# Patient Record
Sex: Female | Born: 1970 | Race: Black or African American | Hispanic: No | Marital: Single | Smoking: Never smoker
Health system: Southern US, Community
[De-identification: ages and names within clinical notes are randomized; demographics above are authoritative.]

## PROBLEM LIST (undated history)

## (undated) DIAGNOSIS — D649 Anemia, unspecified: Secondary | ICD-10-CM

## (undated) DIAGNOSIS — J45909 Unspecified asthma, uncomplicated: Secondary | ICD-10-CM

## (undated) DIAGNOSIS — E669 Obesity, unspecified: Secondary | ICD-10-CM

## (undated) HISTORY — DX: Unspecified asthma, uncomplicated: J45.909

## (undated) HISTORY — DX: Anemia, unspecified: D64.9

## (undated) HISTORY — DX: Obesity, unspecified: E66.9

## (undated) HISTORY — PX: OTHER SURGICAL HISTORY: SHX169

## (undated) HISTORY — PX: CHOLECYSTECTOMY: SHX55

---

## 2004-09-18 ENCOUNTER — Ambulatory Visit: Payer: Self-pay | Admitting: Family Medicine

## 2004-11-19 ENCOUNTER — Ambulatory Visit: Payer: Self-pay | Admitting: Family Medicine

## 2005-04-29 ENCOUNTER — Ambulatory Visit: Payer: Self-pay | Admitting: Family Medicine

## 2005-07-07 ENCOUNTER — Ambulatory Visit: Payer: Self-pay | Admitting: Family Medicine

## 2005-10-02 ENCOUNTER — Ambulatory Visit: Payer: Self-pay | Admitting: Family Medicine

## 2005-10-09 ENCOUNTER — Encounter (HOSPITAL_COMMUNITY): Admission: RE | Admit: 2005-10-09 | Discharge: 2005-11-08 | Payer: Self-pay | Admitting: Family Medicine

## 2005-12-10 ENCOUNTER — Ambulatory Visit: Payer: Self-pay | Admitting: Family Medicine

## 2005-12-11 ENCOUNTER — Ambulatory Visit (HOSPITAL_COMMUNITY): Admission: RE | Admit: 2005-12-11 | Discharge: 2005-12-11 | Payer: Self-pay | Admitting: Family Medicine

## 2006-01-22 ENCOUNTER — Other Ambulatory Visit: Admission: RE | Admit: 2006-01-22 | Discharge: 2006-01-22 | Payer: Self-pay | Admitting: Family Medicine

## 2006-01-22 ENCOUNTER — Encounter: Payer: Self-pay | Admitting: Family Medicine

## 2006-01-22 ENCOUNTER — Ambulatory Visit: Payer: Self-pay | Admitting: Family Medicine

## 2006-07-09 ENCOUNTER — Ambulatory Visit: Payer: Self-pay | Admitting: Family Medicine

## 2006-07-24 ENCOUNTER — Encounter (HOSPITAL_COMMUNITY): Admission: RE | Admit: 2006-07-24 | Discharge: 2006-08-23 | Payer: Self-pay | Admitting: Family Medicine

## 2006-08-06 ENCOUNTER — Ambulatory Visit: Payer: Self-pay | Admitting: Family Medicine

## 2006-09-02 ENCOUNTER — Ambulatory Visit (HOSPITAL_COMMUNITY): Admission: RE | Admit: 2006-09-02 | Discharge: 2006-09-02 | Payer: Self-pay | Admitting: General Surgery

## 2006-09-02 ENCOUNTER — Encounter (INDEPENDENT_AMBULATORY_CARE_PROVIDER_SITE_OTHER): Payer: Self-pay | Admitting: *Deleted

## 2007-06-24 ENCOUNTER — Ambulatory Visit: Payer: Self-pay | Admitting: Family Medicine

## 2007-06-24 ENCOUNTER — Encounter: Payer: Self-pay | Admitting: Family Medicine

## 2007-06-24 ENCOUNTER — Other Ambulatory Visit: Admission: RE | Admit: 2007-06-24 | Discharge: 2007-06-24 | Payer: Self-pay | Admitting: Family Medicine

## 2007-06-26 ENCOUNTER — Encounter: Payer: Self-pay | Admitting: Family Medicine

## 2007-06-26 LAB — CONVERTED CEMR LAB
Candida species: NEGATIVE
Chlamydia, DNA Probe: NEGATIVE
GC Probe Amp, Genital: NEGATIVE
Gardnerella vaginalis: POSITIVE — AB
Trichomonal Vaginitis: NEGATIVE

## 2007-08-18 ENCOUNTER — Ambulatory Visit: Payer: Self-pay | Admitting: Family Medicine

## 2007-10-21 ENCOUNTER — Encounter: Payer: Self-pay | Admitting: Family Medicine

## 2008-05-31 ENCOUNTER — Encounter: Payer: Self-pay | Admitting: Family Medicine

## 2008-05-31 DIAGNOSIS — E669 Obesity, unspecified: Secondary | ICD-10-CM

## 2008-06-13 ENCOUNTER — Telehealth: Payer: Self-pay | Admitting: Family Medicine

## 2008-06-23 ENCOUNTER — Encounter: Payer: Self-pay | Admitting: Family Medicine

## 2008-10-18 ENCOUNTER — Telehealth: Payer: Self-pay | Admitting: Family Medicine

## 2008-10-30 ENCOUNTER — Ambulatory Visit: Payer: Self-pay | Admitting: Family Medicine

## 2009-03-12 ENCOUNTER — Ambulatory Visit: Payer: Self-pay | Admitting: Family Medicine

## 2009-03-12 DIAGNOSIS — L732 Hidradenitis suppurativa: Secondary | ICD-10-CM | POA: Insufficient documentation

## 2009-03-12 DIAGNOSIS — N946 Dysmenorrhea, unspecified: Secondary | ICD-10-CM

## 2009-03-12 DIAGNOSIS — N920 Excessive and frequent menstruation with regular cycle: Secondary | ICD-10-CM

## 2009-03-14 ENCOUNTER — Telehealth: Payer: Self-pay | Admitting: Family Medicine

## 2009-06-11 ENCOUNTER — Telehealth: Payer: Self-pay | Admitting: Family Medicine

## 2009-12-25 ENCOUNTER — Encounter: Payer: Self-pay | Admitting: Physician Assistant

## 2009-12-28 ENCOUNTER — Ambulatory Visit: Payer: Self-pay | Admitting: Family Medicine

## 2009-12-28 DIAGNOSIS — L0390 Cellulitis, unspecified: Secondary | ICD-10-CM

## 2009-12-28 DIAGNOSIS — L0291 Cutaneous abscess, unspecified: Secondary | ICD-10-CM | POA: Insufficient documentation

## 2009-12-31 ENCOUNTER — Telehealth: Payer: Self-pay | Admitting: Physician Assistant

## 2010-05-27 ENCOUNTER — Ambulatory Visit: Payer: Self-pay | Admitting: Family Medicine

## 2010-05-27 ENCOUNTER — Other Ambulatory Visit: Admission: RE | Admit: 2010-05-27 | Discharge: 2010-05-27 | Payer: Self-pay | Admitting: Family Medicine

## 2010-05-27 DIAGNOSIS — N912 Amenorrhea, unspecified: Secondary | ICD-10-CM

## 2010-05-27 DIAGNOSIS — R5381 Other malaise: Secondary | ICD-10-CM

## 2010-05-27 DIAGNOSIS — R5383 Other fatigue: Secondary | ICD-10-CM

## 2010-05-29 ENCOUNTER — Encounter: Payer: Self-pay | Admitting: Family Medicine

## 2010-11-10 ENCOUNTER — Encounter: Payer: Self-pay | Admitting: Family Medicine

## 2010-11-19 NOTE — Letter (Signed)
Summary: history and physical  history and physical   Imported By: Curtis Sites 03/06/2010 14:16:34  _____________________________________________________________________  External Attachment:    Type:   Image     Comment:   External Document

## 2010-11-19 NOTE — Letter (Signed)
Summary: labs  labs   Imported By: Curtis Sites 03/06/2010 14:16:51  _____________________________________________________________________  External Attachment:    Type:   Image     Comment:   External Document

## 2010-11-19 NOTE — Letter (Signed)
Summary: misc.  misc.   Imported By: Curtis Sites 03/06/2010 14:17:13  _____________________________________________________________________  External Attachment:    Type:   Image     Comment:   External Document

## 2010-11-19 NOTE — Assessment & Plan Note (Signed)
Summary: BOIL BUSTED   Vital Signs:  Patient profile:   40 year old female Menstrual status:  regular Height:      64 inches Weight:      185.50 pounds BMI:     31.96 O2 Sat:      98 % on Room air Pulse rate:   106 / minute Pulse rhythm:   regular Resp:     16 per minute BP sitting:   126 / 70  (right arm)  Vitals Entered By: Adella Hare LPN (December 28, 2009 11:14 AM)  Nutrition Counseling: Patient's BMI is greater than 25 and therefore counseled on weight management options.  O2 Flow:  Room air                   CC: boil bursted Is Patient Diabetic? No Pain Assessment Patient in pain? no        CC:  boil bursted.  History of Present Illness: boil on left armpit, first came up aboput 1 wek ago, got larger, was seen in urgent care 2 days ago when she developed chills, and was started on doxycycline. The abcess burst today, she has alot of pain, she does have an appt with a surgeon in 3 days, second episode,had one 6 months ago in the same area no h/o dm  Current Medications (verified): 1)  Ibu 800 Mg Tabs (Ibuprofen) .... Take 1 Tablet By Mouth Three Times A Day For  2 Days Before Onset of Next Menses and For The First 3 Days of The Menses 2)  Ortho-Cyclen (28) 0.25-35 Mg-Mcg Tabs (Norgestimate-Eth Estradiol) .... Use As Directed 3)  Doxycycline Hyclate 100 Mg Caps (Doxycycline Hyclate) .... Take 1 Tablet By Mouth Two Times A Day 4)  Fluconazole 150 Mg Tabs (Fluconazole) .... Take 1 Tablet By Mouth Once A Day  Allergies (verified): No Known Drug Allergies  Review of Systems      See HPI General:  Complains of chills and fever. ENT:  Denies hoarseness and nasal congestion. CV:  Denies chest pain or discomfort and palpitations. Resp:  Denies cough and sputum productive. GI:  Denies abdominal pain, constipation, and diarrhea. GU:  Denies dysuria and urinary frequency. Derm:  See HPI. Endo:  Denies cold intolerance, excessive hunger, excessive thirst,  excessive urination, heat intolerance, polyuria, and weight change.  Physical Exam  General:  Well-developed,obese,in no acute distress; alert,appropriate and cooperative throughout examination. pt in pain. HEENT: No facial asymmetry,  EOMI, No sinus tenderness, TM's Clear, oropharynx  pink and moist.   Chest: Clear to auscultation bilaterally.  CVS: S1, S2, No murmurs, No S3.   Abd: Soft, Nontender.  MS: Adequate ROM spine, hips, shoulders and knees.  Ext: No edema.   CNS: CN 2-12 intact, power tone and sensation normal throughout.   Skin: cellulitis, left anterior chest in axillary area, and draining abcess  Psych: Good eye contact, normal affect.  Memory intact, not anxious or depressed appearing.    Impression & Recommendations:  Problem # 1:  CELLULITIS (ICD-682.9) Assessment Comment Only  The following medications were removed from the medication list:    Doxycycline Hyclate 100 Mg Caps (Doxycycline hyclate) .Marland Kitchen... Take 1 tablet by mouth two times a day  Orders: Rocephin  250mg  (V7846) Admin of Therapeutic Inj  intramuscular or subcutaneous (96295), pt to continue antibiotics prscribed at urgent cARE, , THE AREA WAS CLEANSED WITH n/s AND DRY GAUZE DRESSING APPLIED.She has appt with surgeon in 3 days and is advised that should  her symptoms worsen , she should go to the Ed  Problem # 2:  OBESITY (ICD-278.00) Assessment: Unchanged  Ht: 64 (12/28/2009)   Wt: 185.50 (12/28/2009)   BMI: 31.96 (12/28/2009)  Complete Medication List: 1)  Ibu 800 Mg Tabs (Ibuprofen) .... Take 1 tablet by mouth three times a day for  2 days before onset of next menses and for the first 3 days of the menses 2)  Ortho-cyclen (28) 0.25-35 Mg-mcg Tabs (Norgestimate-eth estradiol) .... Use as directed 3)  Fluconazole 150 Mg Tabs (Fluconazole) .... Take 1 tablet by mouth once a day as needed 4)  Vicodin 5-500 Mg Tabs (Hydrocodone-acetaminophen) .... Take 1 tablet by mouth two times a day as  needed  Patient Instructions: 1)  You will have the abcess cleaned and a dry gauze applied, pls do at home also, usig normal saline. 2)  You will also get pain meds, and med for vag yeast infection if tis develops. 3)  PLS go to the Ed if the redness in the area spreads or you get fever and chills, since the infection is in n area where it can spread rapidly. 4)  You will get Rocephin today in the office  Prescriptions: VICODIN 5-500 MG TABS (HYDROCODONE-ACETAMINOPHEN) Take 1 tablet by mouth two times a day as needed  #30 x 0   Entered by:   Adella Hare LPN   Authorized by:   Syliva Overman MD   Signed by:   Adella Hare LPN on 86/57/8469   Method used:   Printed then faxed to ...       [facility] [address] (retail)       [address]       [place], [place]  [address]       Ph: [phone] or [phone]       Fax: [phone]   RxID:   [id number] FLUCONAZOLE 150 MG TABS (FLUCONAZOLE) Take 1 tablet by mouth once a day as needed  #3 x 0   Entered by:   Adella Hare LPN   Authorized by:   Syliva Overman MD   Signed by:   Adella Hare LPN on 60/63/0160   Method used:   Printed then faxed to ...       [facility] [address] (retail)       [address]       [place], [place]  [address]       Ph: [phone] or [phone]       Fax: [phone]   RxID:   [id number] VICODIN 5-500 MG TABS (HYDROCODONE-ACETAMINOPHEN) Take 1 tablet by mouth two times a day as needed  #30 x 0   Entered and Authorized by:   Syliva Overman MD   Signed by:   Syliva Overman MD on 12/28/2009   Method used:   Printed then faxed to ...       [facility] [address] (retail)       [address]       [place], [place]  [address]       Ph: [phone] or [phone]       Fax: [phone]   RxID:   [id number] FLUCONAZOLE 150 MG TABS (FLUCONAZOLE) Take 1 tablet by mouth once a day as  needed  #3 x 0   Entered and Authorized by:  Syliva Overman MD   Signed by:   Syliva Overman MD on 12/28/2009   Method used:   Electronically to        [facility] [address] (retail)       [address]       [place], [place]  [address]       Ph: [phone] or [phone]       Fax: [phone]   RxID:   [id number]    Medication Administration  Injection # 1:    Medication: Rocephin  250mg     Diagnosis: CELLULITIS (ICD-682.9)    Route: IM    Site: LUOQ gluteus    Exp Date: 4/13    Lot #: [id number]    Mfr: novaplus    Comments: rocephin 500mg  given    Patient tolerated injection without complications    Given by: Adella Hare LPN (December 28, 2009 11:57 AM)  Orders Added: 1)  Est. Patient Level III [[address]] 2)  Rocephin  250mg  [J0696] 3)  Admin of Therapeutic Inj  intramuscular or subcutaneous [47425]

## 2010-11-19 NOTE — Letter (Signed)
Summary: consults  consults   Imported By: Curtis Sites 03/06/2010 14:15:09  _____________________________________________________________________  External Attachment:    Type:   Image     Comment:   External Document

## 2010-11-19 NOTE — Letter (Signed)
Summary: progress notes  progress notes   Imported By: Curtis Sites 03/06/2010 14:17:48  _____________________________________________________________________  External Attachment:    Type:   Image     Comment:   External Document

## 2010-11-19 NOTE — Assessment & Plan Note (Signed)
Summary: BOIL- ROOM 1   Vital Signs:  Patient profile:   40 year old female Menstrual status:  regular Height:      64 inches Weight:      187.50 pounds BMI:     32.30 O2 Sat:      100 % on Room air Pulse rate:   100 / minute Resp:     16 per minute BP sitting:   130 / 80  (left arm)  Vitals Entered By: Adella Hare LPN (December 25, 3760 4:27 PM) CC: boil under left arm Is Patient Diabetic? No Pain Assessment Patient in pain? no        CC:  boil under left arm.  History of Present Illness: Pt is here with a "boil" in her Lt armpit.  This started a couple days ago & has gotten larger & more painful.  No drainage.  No fever or chills.  She has had these before & had them drained.  Current Medications (verified): 1)  Ibu 800 Mg Tabs (Ibuprofen) .... Take 1 Tablet By Mouth Three Times A Day For  2 Days Before Onset of Next Menses and For The First 3 Days of The Menses 2)  Ortho-Cyclen (28) 0.25-35 Mg-Mcg Tabs (Norgestimate-Eth Estradiol) .... Use As Directed 3)  Doxycycline Hyclate 100 Mg Caps (Doxycycline Hyclate) .... Take 1 Tablet By Mouth Two Times A Day 4)  Fluconazole 150 Mg Tabs (Fluconazole) .... Take 1 Tablet By Mouth Once A Day  Allergies (verified): No Known Drug Allergies  Physical Exam  Skin:  Lt axilla Approx 3 cm erythematous, fluctuant abcess noted with surrounding erythema. Very TTP.   Complete Medication List: 1)  Ibu 800 Mg Tabs (Ibuprofen) .... Take 1 tablet by mouth three times a day for  2 days before onset of next menses and for the first 3 days of the menses 2)  Ortho-cyclen (28) 0.25-35 Mg-mcg Tabs (Norgestimate-eth estradiol) .... Use as directed 3)  Doxycycline Hyclate 100 Mg Caps (Doxycycline hyclate) .... Take 1 tablet by mouth two times a day 4)  Fluconazole 150 Mg Tabs (Fluconazole) .... Take 1 tablet by mouth once a day  Patient Instructions: 1)  Pt was referred to urgent care for I&D.  Advised pt she should be seen tonight to have this  done so that it won't worrsen.  She will need prescription antibiotics too but will let urgent care prescribe that for her since they will be treating this.  Appended Document: BOIL- ROOM 1 No charges wer filed for this office visit.  She was refunded her copay of $50.00 and IDX along with cashtrack had been adjusted.

## 2010-11-19 NOTE — Letter (Signed)
Summary: phone notes  phone notes   Imported By: Curtis Sites 03/06/2010 14:17:30  _____________________________________________________________________  External Attachment:    Type:   Image     Comment:   External Document

## 2010-11-19 NOTE — Letter (Signed)
Summary: demographic  demographic   Imported By: Curtis Sites 03/06/2010 14:16:10  _____________________________________________________________________  External Attachment:    Type:   Image     Comment:   External Document

## 2010-11-19 NOTE — Assessment & Plan Note (Signed)
Summary: OV   Vital Signs:  Patient profile:   40 year old female Menstrual status:  regular LMP:     05/27/2010 Height:      64 inches Weight:      192.75 pounds BMI:     33.21 O2 Sat:      98 % Pulse rate:   87 / minute Pulse rhythm:   regular Resp:     16 per minute BP sitting:   112 / 82  (left arm) Cuff size:   large  Vitals Entered By: Everitt Amber LPN (May 27, 2010 10:05 AM)  Nutrition Counseling: Patient's BMI is greater than 25 and therefore counseled on weight management options. CC: Follow up chronic problems, has been spotting since lastweek and she isn't supposed to begin until thursday.  LMP (date): 05/27/2010     Enter LMP: 05/27/2010 Last PAP Result Normal   CC:  Follow up chronic problems and has been spotting since lastweek and she isn't supposed to begin until thursday. Marland Kitchen  History of Present Illness: Reports  that she has been doing fairly well. She is concerned about weight gain and Denies recent fever or chills. Denies sinus pressure, nasal congestion , ear pain or sore throat. Denies chest congestion, or cough productive of sputum. Denies chest pain, palpitations, PND, orthopnea or leg swelling. Denies abdominal pain, nausea, vomitting, diarrhea or constipation. Denies change in bowel movements or bloody stool. Denies dysuria , frequency, incontinence or hesitancy. Denies  joint pain, swelling, or reduced mobility. Denies headaches, vertigo, seizures. Denies depression, anxiety or insomnia. Denies  rash, lesions, or itch.     Current Medications (verified): 1)  Ibu 800 Mg Tabs (Ibuprofen) .... Take 1 Tablet By Mouth Three Times A Day For  2 Days Before Onset of Next Menses and For The First 3 Days of The Menses  Allergies (verified): No Known Drug Allergies  Review of Systems      See HPI General:  Complains of fatigue. Eyes:  Denies blurring and discharge. MS:  Denies joint pain and stiffness. Psych:  Denies anxiety and  depression. Endo:  Denies excessive thirst and excessive urination. Heme:  Denies abnormal bruising and bleeding. Allergy:  Denies hives or rash and itching eyes.  Physical Exam  General:  Well-developed,well-nourished,in no acute distress; alert,appropriate and cooperative throughout examination Head:  Normocephalic and atraumatic without obvious abnormalities. No apparent alopecia or balding. Eyes:  No corneal or conjunctival inflammation noted. EOMI. Perrla. Funduscopic exam benign, without hemorrhages, exudates or papilledema. Vision grossly normal. Ears:  External ear exam shows no significant lesions or deformities.  Otoscopic examination reveals clear canals, tympanic membranes are intact bilaterally without bulging, retraction, inflammation or discharge. Hearing is grossly normal bilaterally. Nose:  External nasal examination shows no deformity or inflammation. Nasal mucosa are pink and moist without lesions or exudates. Mouth:  pharynx pink and moist, fair dentition, and teeth missing.   Neck:  No deformities, masses, or tenderness noted. Chest Wall:  No deformities, masses, or tenderness noted. Breasts:  No mass, nodules, thickening, tenderness, bulging, retraction, inflamation, nipple discharge or skin changes noted.   Lungs:  Normal respiratory effort, chest expands symmetrically. Lungs are clear to auscultation, no crackles or wheezes. Heart:  Normal rate and regular rhythm. S1 and S2 normal without gallop, murmur, click, rub or other extra sounds. Abdomen:  Bowel sounds positive,abdomen soft and non-tender without masses, organomegaly or hernias noted. Genitalia:  Normal introitus for age, no external lesions, no vaginal discharge, mucosa pink  and moist, no vaginal or cervical lesions, no vaginal atrophy, no friaility or hemorrhage, normal uterus size and position, no adnexal masses or tenderness Msk:  No deformity or scoliosis noted of thoracic or lumbar spine.   Pulses:  R and L  carotid,radial,femoral,dorsalis pedis and posterior tibial pulses are full and equal bilaterally Extremities:  No clubbing, cyanosis, edema, or deformity noted with normal full range of motion of all joints.   Neurologic:  No cranial nerve deficits noted. Station and gait are normal. Plantar reflexes are down-going bilaterally. DTRs are symmetrical throughout. Sensory, motor and coordinative functions appear intact. Skin:  Intact without suspicious lesions or rashes Cervical Nodes:  No lymphadenopathy noted Axillary Nodes:  No palpable lymphadenopathy Inguinal Nodes:  No significant adenopathy Psych:  Cognition and judgment appear intact. Alert and cooperative with normal attention span and concentration. No apparent delusions, illusions, hallucinations   Impression & Recommendations:  Problem # 1:  SCREENING FOR MALIGNANT NEOPLASM OF THE CERVIX (ICD-V76.2) Assessment Comment Only  Orders: Pap Smear (14782)  Problem # 2:  OBESITY (ICD-278.00) Assessment: Deteriorated  Ht: 64 (05/27/2010)   Wt: 192.75 (05/27/2010)   BMI: 33.21 (05/27/2010)  Problem # 3:  AMENORRHEA (ICD-626.0) Assessment: Comment Only  The following medications were removed from the medication list:    Ortho-cyclen (28) 0.25-35 Mg-mcg Tabs (Norgestimate-eth estradiol) ..... Use as directed  Orders: Urine Pregnancy Test  (95621), negative  Complete Medication List: 1)  Ibu 800 Mg Tabs (Ibuprofen) .... Take 1 tablet by mouth three times a day for  2 days before onset of next menses and for the first 3 days of the menses 2)  Clotrimazole-betamethasone 1-0.05 % Crea (Clotrimazole-betamethasone) .... Apply twice daily top soles of feet and under the right breast x 2 weeks , and then as apetite  Other Orders: T-Basic Metabolic Panel [id number]) T-Lipid Profile [id number]) T-CBC w/Diff [id number])  Patient Instructions: 1)  F/u in 5.5 months. 2)  It is important that you exercise regularly at least 30 minutes  5 times a week. If you develop chest pain, have severe difficulty breathing, or feel very tired , stop exercising immediately and seek medical attention. 3)  You need to lose weight. Consider a lower calorie diet and regular exercise.  4)  BMP prior to visit, ICD-9: 5)  Lipid Panel prior to visit, ICD-9:   fasting as soon as you are able 6)  CBC w/ Diff prior to visit, ICD-9: Prescriptions: CLOTRIMAZOLE-BETAMETHASONE 1-0.05 % CREA (CLOTRIMAZOLE-BETAMETHASONE) apply twice daily top soles of feet and under the right breast x 2 weeks , and then as apetite  #45 gm x 1   Entered and Authorized by:   Syliva Overman MD   Signed by:   Syliva Overman MD on 05/27/2010   Method used:   Electronically to        [facility] [address] (retail)       [address]       [place], [place]  [address]       Ph: [phone] or [phone]       Fax: [phone]   RxID:   [id number]

## 2010-11-19 NOTE — Progress Notes (Signed)
Summary: BOIL  Phone Note Call from Patient   Summary of Call: WANTS A REF TO SEE MARK JENKINS   FOR BOIL  AND SEE IF HE DOES THE PRO. IN THE OFFICE TAKE THE CORE OUT OR BR[place]ORD CALL BACK AT  [phone] Initial call taken by: Lind Guest,  December 31, 2009 4:20 PM  Follow-up for Phone Call        OK to refer pt. Dx Abcess in Lt axilla. Follow-up by: Esperanza Sheets PA,  December 31, 2009 4:47 PM  Additional Follow-up for Phone Call Additional follow up Details #1::        pt has appt at dr. Lovell Sheehan office for 01/08/2010 1:30. pt notified and was left a message.  Additional Follow-up by: Rudene Anda,  January 01, 2010 9:08 AM

## 2010-11-19 NOTE — Letter (Signed)
Summary: Letter  Letter   Imported By: Lind Guest 05/30/2010 14:54:57  _____________________________________________________________________  External Attachment:    Type:   Image     Comment:   External Document

## 2010-11-19 NOTE — Letter (Signed)
Summary: xrays  xrays   Imported By: Curtis Sites 03/06/2010 14:18:05  _____________________________________________________________________  External Attachment:    Type:   Image     Comment:   External Document

## 2010-11-22 ENCOUNTER — Ambulatory Visit: Payer: Self-pay | Admitting: Family Medicine

## 2010-11-22 ENCOUNTER — Ambulatory Visit: Admit: 2010-11-22 | Payer: Self-pay | Admitting: Family Medicine

## 2011-03-07 NOTE — H&P (Signed)
Jane Wu, Jane Wu            ACCOUNT NO.:  [id number]   MEDICAL RECORD NO.:  [id number]          PATIENT TYPE:  AMB   LOCATION:  DAY                           FACILITY:  [facility]   PHYSICIAN:  Dalia Heading, M.D.  DATE OF BIRTH:  06-Feb-1971   DATE OF ADMISSION:  DATE OF DISCHARGE:  LH                                HISTORY & PHYSICAL   CHIEF COMPLAINT:  Chronic cholecystitis.   HISTORY OF PRESENT ILLNESS:  The patient is a 40 year old black female who  was referred for evaluation and treatment of biliary colic secondary to  chronic cholecystitis.  She has been having right upper quadrant abdominal  pain, nausea, and bloating for many weeks.  She does have fatty food  intolerance.  No fever, chills, or jaundice have been noted.   PAST MEDICAL HISTORY:  Reflux disease.   PAST SURGICAL HISTORY:  Unremarkable.   CURRENT MEDICATIONS:  AcipHex.   ALLERGIES:  No known drug allergies.   REVIEW OF SYSTEMS:  Noncontributory.   PHYSICAL EXAMINATION:  GENERAL:  The patient is a well-developed, well-  nourished black female in no acute distress.  HEENT: No scleral icterus.  LUNGS:  Clear to auscultation with equal breath sounds bilaterally.  HEART:  Regular rate and rhythm without S3, S4, or murmurs.  ABDOMEN:  Soft, nondistended.  She is tender in the right upper quadrant to  palpation.  No hepatosplenomegaly, masses, or hernias are identified.   Ultrasound gallbladder is negative.  HIDA scan reveals chronic cholecystitis  with a low gallbladder ejection fraction and reproducible symptoms.   IMPRESSION:  Chronic cholecystitis.   PLAN:  The patient is scheduled for laparoscopic cholecystectomy on September 02, 2006.  The risks and benefits of the procedure including bleeding,  infection, hepatobiliary injury, and the possibility of an open procedure,  were fully explained to the patient, who gave informed consent.      Dalia Heading, M.D.  Electronically Signed     MAJ/MEDQ  D:  08/27/2006  T:  08/28/2006  Job:  [id number]   cc:   [facility]  Fax: [phone]   Milus Mallick. Lodema Hong, M.D.  Fax: [phone]

## 2011-03-07 NOTE — Op Note (Signed)
Jane Wu, Jane Wu            ACCOUNT NO.:  [id number]   MEDICAL RECORD NO.:  [id number]          PATIENT TYPE:  AMB   LOCATION:  DAY                           FACILITY:  [facility]   PHYSICIAN:  Dalia Heading, M.D.  DATE OF BIRTH:  04-21-1971   DATE OF PROCEDURE:  09/02/2006  DATE OF DISCHARGE:                                 OPERATIVE REPORT   PREOPERATIVE DIAGNOSIS:  Chronic cholecystitis.   POSTOPERATIVE DIAGNOSIS:  Chronic cholecystitis.   PROCEDURE:  Laparoscopic cholecystectomy.   SURGEON:  Dr. Franky Macho.   ASSISTANT:  Dr. Arna Snipe.   ANESTHESIA:  General endotracheal.   INDICATIONS:  The patient is a 40 year old black female who presents with  biliary colic, secondary to chronic cholecystitis.  The risks and benefits  of the procedure, including bleeding, infection, hepatobiliary injury and  the possibility of an open procedure were fully explained to the patient,  who gave an informed consent.   PROCEDURE NOTE:  The patient was placed in the supine position.  After  induction of general endotracheal anesthesia, the abdomen was prepped and  draped using the usual sterile technique with Betadine.  The surgical site  confirmation was performed.  A supraumbilical incision was made down to the  fascia.  The Veress needle was introduced into the abdominal cavity and  confirmation of placement was done using the saline drop test.  The abdomen  was then insufflated to 16 mmHg pressure.  An 11 mm trocar was introduced  into the abdominal cavity under direct visualization without difficulty.  The patient was placed in the reversed Trendelenburg position.  An  additional 11-mm trocar was placed in the epigastric region and 5 mm trocars  were placed in the right upper quadrant and right flank regions.  The liver  was inspected and noted to be within normal limits.  The gallbladder was  retracted superiorly and laterally.  Dissection was begun around the  infundibulum  of the gallbladder.  The cystic duct was first identified.  Its  juncture to the infundibulum fully identified.  Endo clips placed proximally  and distally on the cystic duct, and the cystic duct was divided.  This was  likewise done on the cystic artery.  The gallbladder was then freed away  from the gallbladder fossa using Bovie electrocautery.  The gallbladder was  delivered through the epigastric trocar site using the EndoCatch bag.  The  gallbladder fossa was inspected, and no abnormal bleeding or bile leakage  was noted.  Surgicel was placed in the gallbladder fossa.  All fluid and air  were then evacuated from the abdominal cavity, prior to removal of the  trocars.  All wounds were irrigated with normal saline.  All wounds were injected with  0.5%Sensorcaine.  The supraumbilical fascia was reapproximated using a #0  Vicryl interrupted suture.  All skin incisions were closed using staples.  Betadine ointment and dry sterile dressings were applied.  All tape and  needle  counts were correct at the end of the procedure.   The patient was extubated in the operating room and went back to the  recovery room awake, in stable condition.  Complications none.   SPECIMEN:  Gallbladder.   ESTIMATED BLOOD LOSS:  The blood loss was minimal.      Dalia Heading, M.D.  Electronically Signed     MAJ/MEDQ  D:  09/02/2006  T:  09/02/2006  Job:  [id number]   cc:   Milus Mallick. Lodema Hong, M.D.  Fax: [phone]

## 2011-07-09 ENCOUNTER — Encounter: Payer: Self-pay | Admitting: Family Medicine

## 2011-07-10 ENCOUNTER — Ambulatory Visit: Payer: Self-pay | Admitting: Family Medicine

## 2011-07-11 ENCOUNTER — Encounter: Payer: Self-pay | Admitting: Family Medicine

## 2012-09-06 ENCOUNTER — Telehealth: Payer: Self-pay | Admitting: Family Medicine

## 2012-09-06 NOTE — Telephone Encounter (Signed)
Called patient and left message for them to return call at the office   

## 2012-09-06 NOTE — Telephone Encounter (Signed)
I see your name as the PCP but I don't see where this patient has ever been here. If not, do I just advise a new pt appt with [facility] when she returns my call?

## 2012-09-06 NOTE — Telephone Encounter (Signed)
Hasn't been seen by me since 2011. If needs to be seen soon then with Dr Jeanice Lim, if she does not mind switching todr [facility] , then keep her with Dr Jeanice Lim pls, I will not be available to see her this week and sounds as if she needs to be seen so overall I suggest re establishing with Dr Jeanice Lim

## 2012-09-07 NOTE — Telephone Encounter (Signed)
Patient had no insurance she would be self pay

## 2012-09-07 NOTE — Telephone Encounter (Signed)
LEFT MESSAGE FOR PATIENT TO RETURN CALL  °

## 2012-09-07 NOTE — Telephone Encounter (Signed)
Need to goo through usual self pay process, whatever is the decided fee and to bring it to visit etc, again I am not available for the next 2 weeks

## 2012-09-08 NOTE — Telephone Encounter (Signed)
We are not taking self pay patients

## 2012-10-06 ENCOUNTER — Ambulatory Visit: Payer: Self-pay | Admitting: Family Medicine

## 2012-12-10 ENCOUNTER — Telehealth: Payer: Self-pay | Admitting: Family Medicine

## 2012-12-10 NOTE — Telephone Encounter (Signed)
No new appointments for me are available before May

## 2019-04-19 ENCOUNTER — Telehealth: Payer: Self-pay | Admitting: Family

## 2019-08-10 LAB — LIPID PANEL
Cholesterol: 135 (ref 0–200)
HDL: 35 (ref 35–70)
LDL Cholesterol: 86
Triglycerides: 53 (ref 40–160)

## 2019-08-10 LAB — COMPREHENSIVE METABOLIC PANEL
Albumin: 3.7 (ref 3.5–5.0)
Calcium: 8.9 (ref 8.7–10.7)
GFR calc Af Amer: 121
GFR calc non Af Amer: 105

## 2019-08-10 LAB — HEPATIC FUNCTION PANEL
ALT: 9 (ref 7–35)
AST: 14 (ref 13–35)
Alkaline Phosphatase: 62 (ref 25–125)
Bilirubin, Total: 0.3

## 2019-08-10 LAB — CBC: RBC: 4.24 (ref 3.87–5.11)

## 2019-08-10 LAB — BASIC METABOLIC PANEL
BUN: 8 (ref 4–21)
CO2: 25 — AB (ref 13–22)
Chloride: 104 (ref 99–108)
Creatinine: 0.7 (ref <=1.1)
Glucose: 73
Potassium: 4.5 (ref 3.4–5.3)
Sodium: 136 — AB (ref 137–147)

## 2019-08-10 LAB — CBC AND DIFFERENTIAL
HCT: 37 (ref 36–46)
Hemoglobin: 11.7 — AB (ref 12.0–16.0)
Platelets: 332 (ref 150–399)
WBC: 6.2

## 2019-08-10 LAB — TSH: TSH: 1.82 (ref <=5.90)

## 2019-08-29 ENCOUNTER — Ambulatory Visit: Payer: BC Managed Care – PPO | Admitting: Podiatry

## 2019-10-16 DIAGNOSIS — D649 Anemia, unspecified: Secondary | ICD-10-CM

## 2019-10-16 LAB — FSH/LH: FSH: 22.7

## 2019-10-16 LAB — LUTEINIZING HORMONE: LH: 2.5

## 2019-10-16 LAB — FOLLICLE STIMULATING HORMONE: FSH: 2.5

## 2019-12-25 ENCOUNTER — Ambulatory Visit: Payer: Self-pay | Attending: Internal Medicine

## 2019-12-25 DIAGNOSIS — Z23 Encounter for immunization: Secondary | ICD-10-CM | POA: Insufficient documentation

## 2019-12-25 NOTE — Progress Notes (Signed)
   Covid-19 Vaccination Clinic  Name:  Jane Wu    MRN: [id number] DOB: [birth year]  12/25/2019  [name] was observed post Covid-19 immunization for 15 minutes without incident. She was provided with Vaccine Information Sheet and instruction to access the V-Safe system.   [name] was instructed to call 911 with any severe reactions post vaccine: Marland Kitchen Difficulty breathing  . Swelling of face and throat  . A fast heartbeat  . A bad rash all over body  . Dizziness and weakness   Immunizations Administered    Name Date Dose VIS Date Route   Pfizer COVID-19 Vaccine 12/25/2019  5:14 PM 0.3 mL 09/30/2019 Intramuscular   Manufacturer: [employer], [employer]   Lot: [id number]   NDC: [address]-8

## 2020-01-15 ENCOUNTER — Ambulatory Visit: Payer: Self-pay | Attending: Internal Medicine

## 2020-01-15 ENCOUNTER — Other Ambulatory Visit: Payer: Self-pay

## 2020-01-15 DIAGNOSIS — Z23 Encounter for immunization: Secondary | ICD-10-CM

## 2020-01-15 NOTE — Progress Notes (Signed)
   Covid-19 Vaccination Clinic  Name:  Jane Wu    MRN: [id number] DOB: 10-13-1971  01/15/2020  Jane Wu was observed post Covid-19 immunization for 15 minutes without incident. She was provided with Vaccine Information Sheet and instruction to access the V-Safe system.   Jane Wu was instructed to call 911 with any severe reactions post vaccine: Marland Kitchen Difficulty breathing  . Swelling of face and throat  . A fast heartbeat  . A bad rash all over body  . Dizziness and weakness   Immunizations Administered    Name Date Dose VIS Date Route   Pfizer COVID-19 Vaccine 01/15/2020  2:20 PM 0.3 mL 09/30/2019 Intramuscular   Manufacturer: [employer], [employer]   Lot: [id number]   NDC: [address]-9

## 2021-11-08 ENCOUNTER — Other Ambulatory Visit: Payer: Self-pay

## 2021-11-08 ENCOUNTER — Ambulatory Visit (HOSPITAL_COMMUNITY)
Admission: EM | Admit: 2021-11-08 | Discharge: 2021-11-08 | Disposition: A | Discharge status: Home / Self Care | Payer: 59 | Attending: Family Medicine | Admitting: Family Medicine

## 2021-11-08 ENCOUNTER — Encounter (HOSPITAL_COMMUNITY): Payer: Self-pay | Admitting: Emergency Medicine

## 2021-11-08 DIAGNOSIS — Z20822 Contact with and (suspected) exposure to covid-19: Secondary | ICD-10-CM | POA: Diagnosis not present

## 2021-11-08 DIAGNOSIS — J069 Acute upper respiratory infection, unspecified: Secondary | ICD-10-CM | POA: Insufficient documentation

## 2021-11-08 LAB — SARS CORONAVIRUS 2 (TAT 6-24 HRS): SARS Coronavirus 2: NEGATIVE

## 2021-11-08 LAB — POC INFLUENZA A AND B ANTIGEN (URGENT CARE ONLY)
INFLUENZA A ANTIGEN, POC: NEGATIVE
INFLUENZA B ANTIGEN, POC: NEGATIVE

## 2021-11-08 MED ORDER — TRIAMCINOLONE ACETONIDE 40 MG/ML IJ SUSP
40.0000 mg | Freq: Once | INTRAMUSCULAR | Status: AC
  Administered 2021-11-08: 40 mg via INTRAMUSCULAR

## 2021-11-08 MED ORDER — TRIAMCINOLONE ACETONIDE 40 MG/ML IJ SUSP
INTRAMUSCULAR | Status: AC
  Filled 2021-11-08: qty 1

## 2021-11-08 NOTE — ED Provider Notes (Signed)
[facility]    CSN: [id number] Arrival date & time: 11/08/21  1038      History   Chief Complaint Chief Complaint  Patient presents with   Nasal Congestion    HPI Jane Wu is a 51 y.o. female.   HPI Here for history of cough and nasal congestion since January 17.  She has not measured fever, but she has had some chills.  She did a home COVID test that was negative.  Has some sinus pressure also.  She relates a history of possible sinus infection symptoms in October, again in November, and again in December.  Her primary doctor has prescribed multiple antibiotics each time.  Currently PCP has given her a prescription for Augmentin and cephalexin, per her report.  She has begun taking that.  She was exposed to her mom who last week began having upper respiratory symptoms.  She also mentions some concern about exposure to mold, as the house she has been living in has had some moisture problems recently  Past Medical History:  Diagnosis Date   Anemia, unspecified    Obesity     Patient Active Problem List   Diagnosis Date Noted   Anemia, unspecified    AMENORRHEA 05/27/2010   FATIGUE 05/27/2010   CELLULITIS 12/28/2009   DYSMENORRHEA 03/12/2009   MENORRHAGIA 03/12/2009   HIDRADENITIS SUPPURATIVA 03/12/2009   OBESITY 05/31/2008    Past Surgical History:  Procedure Laterality Date   CHOLECYSTECTOMY     right auxillary dissection for hydradenitis      OB History   No obstetric history on file.      Home Medications    Prior to Admission medications   Medication Sig Start Date End Date Taking? Authorizing Provider  amoxicillin-clavulanate (AUGMENTIN) 875-125 MG tablet SMARTSIG:1 Tablet(s) By Mouth Every 12 Hours 09/05/21  Yes [provider]  cephALEXin (KEFLEX) 500 MG capsule Take 500 mg by mouth 4 (four) times daily. 08/09/21  Yes [provider]  levocetirizine (XYZAL) 5 MG tablet SMARTSIG:1 Tablet(s) By Mouth Every  Evening 08/19/21  Yes [provider]  ALPRAZolam (XANAX) 0.25 MG tablet Take 0.25 mg by mouth 3 (three) times daily as needed. 08/10/19   [provider]  atorvastatin (LIPITOR) 20 MG tablet Take 20 mg by mouth every other day. 08/26/21   [provider]  benzonatate (TESSALON) 100 MG capsule Take 100 mg by mouth 3 (three) times daily. 07/14/19   [provider]  clotrimazole-betamethasone (LOTRISONE) cream Apply topically 2 (two) times daily. Apply twice daily top soles of feet and under the right breast x2 weeks , and then as apetite     [provider]  cyclobenzaprine (FLEXERIL) 10 MG tablet Take 10 mg by mouth 2 (two) times daily. 08/12/19   [provider]  ibuprofen (ADVIL,MOTRIN) 800 MG tablet Take 800 mg by mouth 3 (three) times daily. Take 1 tablet by mouth three times a day for 2 days before onset of next menses and for the first 3 days of the menses     [provider]    Family History Family History  Problem Relation Age of Onset   Hypertension Mother        [name]    GER disease Mother     Social History Social History   Tobacco Use   Smoking status: Never  Substance Use Topics   Alcohol use: No   Drug use: No     Allergies   Bactrim [sulfamethoxazole-trimethoprim]  and Morphine and related   Review of Systems Review of Systems   Physical Exam Triage Vital Signs ED Triage Vitals  Enc Vitals Group     BP 11/08/21 1058 (!) 145/90     Pulse Rate 11/08/21 1058 99     Resp 11/08/21 1058 16     Temp 11/08/21 1058 98.7 F (37.1 C)     Temp Source 11/08/21 1058 Oral     SpO2 11/08/21 1058 97 %     Weight --      Height --      Head Circumference --      Peak Flow --      Pain Score 11/08/21 1104 0     Pain Loc --      Pain Edu? --      Excl. in GC? --    No data found.  Updated Vital Signs BP (!) 145/90 (BP Location: Right Arm)    Pulse 99    Temp 98.7 F (37.1 C) (Oral)    Resp 16    LMP   (LMP Unknown)    SpO2 97%   Visual Acuity Right Eye Distance:   Left Eye Distance:   Bilateral Distance:    Right Eye Near:   Left Eye Near:    Bilateral Near:     Physical Exam Vitals reviewed.  Constitutional:      General: She is not in acute distress.    Appearance: She is not toxic-appearing.  HENT:     Right Ear: Tympanic membrane and ear canal normal.     Left Ear: Tympanic membrane and ear canal normal.     Nose: Nose normal.     Mouth/Throat:     Mouth: Mucous membranes are moist.     Pharynx: No oropharyngeal exudate or posterior oropharyngeal erythema.  Eyes:     Extraocular Movements: Extraocular movements intact.     Conjunctiva/sclera: Conjunctivae normal.     Pupils: Pupils are equal, round, and reactive to light.  Cardiovascular:     Rate and Rhythm: Normal rate and regular rhythm.     Heart sounds: No murmur heard. Pulmonary:     Effort: Pulmonary effort is normal. No respiratory distress.     Breath sounds: No wheezing, rhonchi or rales.  Chest:     Chest wall: No tenderness.  Musculoskeletal:     Cervical back: Neck supple.  Lymphadenopathy:     Cervical: No cervical adenopathy.  Skin:    Capillary Refill: Capillary refill takes less than 2 seconds.     Coloration: Skin is not jaundiced or pale.  Neurological:     General: No focal deficit present.     Mental Status: She is alert and oriented to person, place, and time.  Psychiatric:        Behavior: Behavior normal.     UC Treatments / Results  Labs (all labs ordered are listed, but only abnormal results are displayed) Labs Reviewed  SARS CORONAVIRUS 2 (TAT 6-24 HRS)  POC INFLUENZA A AND B ANTIGEN (URGENT CARE ONLY)    EKG   Radiology No results found.  Procedures Procedures (including critical care time)  Medications Ordered in UC Medications  triamcinolone acetonide (KENALOG-40) injection 40 mg (has no administration in time range)    Initial Impression / Assessment and  Plan / UC Course  I have reviewed the triage vital signs and the nursing notes.  Pertinent labs & imaging results that were available during my care of  the patient were reviewed by me and considered in my medical decision making (see chart for details).     Flu test negative. Will let her finish the augmentin since she has started it. Steroid shot for poss allergic rhinitis. Final Clinical Impressions(s) / UC Diagnoses   Final diagnoses:  Viral upper respiratory tract infection     Discharge Instructions      Your flu test was negative.  You have been swabbed for COVID, and the test will result in the next 24 hours. Our staff will call you if positive. If the test is positive, you should quarantine for 5 days.   Go ahead and finish the amoxicillin/clavulanic acid, and continue the flonase and the saline nose spray.  You have been given a shot of triamcinolone, a steroid to help with inflammation in your sinuses  Talk to your primary doctor about possible ENT referral/allergy testing/sinus xrays     ED Prescriptions   None    I have reviewed the PDMP during this encounter.   Zenia Resides, MD 11/08/21 [phone]

## 2021-11-08 NOTE — ED Triage Notes (Signed)
Patient c/o nasal congestion and nonproductive cough x 3 days.   Patient denies fever.   Patient endorses chills at times.   Patient took an at home COVID test with Negative results.   Patient endorses runny nose and sneezing.   Patient endorses previous sinus infection in December, received Augmentin, Azithromycin, and Cephalexin for this episode, symptoms resolved then.   Patient is requesting a COVID and Flu test today.   Patient received a new prescription for Augmentin and Cephalexin that she's currently taking.

## 2021-11-08 NOTE — Discharge Instructions (Addendum)
Your flu test was negative.  You have been swabbed for COVID, and the test will result in the next 24 hours. Our staff will call you if positive. If the test is positive, you should quarantine for 5 days.   Go ahead and finish the amoxicillin/clavulanic acid, and continue the flonase and the saline nose spray.  You have been given a shot of triamcinolone, a steroid to help with inflammation in your sinuses  Talk to your primary doctor about possible ENT referral/allergy testing/sinus xrays

## 2021-12-14 ENCOUNTER — Other Ambulatory Visit: Payer: Self-pay | Admitting: Sports Medicine

## 2022-02-01 ENCOUNTER — Ambulatory Visit
Admission: EM | Admit: 2022-02-01 | Discharge: 2022-02-01 | Disposition: A | Discharge status: Home / Self Care | Payer: 59 | Attending: Urgent Care | Admitting: Urgent Care

## 2022-02-01 ENCOUNTER — Emergency Department (HOSPITAL_COMMUNITY)
Admission: EM | Admit: 2022-02-01 | Discharge: 2022-02-01 | Discharge status: Absent without leave | Payer: 59 | Source: Home / Self Care

## 2022-02-01 ENCOUNTER — Emergency Department (HOSPITAL_COMMUNITY)
Admission: EM | Admit: 2022-02-01 | Discharge: 2022-02-01 | Disposition: A | Discharge status: Home / Self Care | Payer: 59 | Attending: Emergency Medicine | Admitting: Emergency Medicine

## 2022-02-01 ENCOUNTER — Encounter (HOSPITAL_COMMUNITY): Payer: Self-pay | Admitting: Emergency Medicine

## 2022-02-01 ENCOUNTER — Ambulatory Visit (INDEPENDENT_AMBULATORY_CARE_PROVIDER_SITE_OTHER): Payer: 59

## 2022-02-01 ENCOUNTER — Other Ambulatory Visit: Payer: Self-pay

## 2022-02-01 ENCOUNTER — Emergency Department (HOSPITAL_COMMUNITY): Payer: 59

## 2022-02-01 DIAGNOSIS — Z1152 Encounter for screening for COVID-19: Secondary | ICD-10-CM

## 2022-02-01 DIAGNOSIS — J40 Bronchitis, not specified as acute or chronic: Secondary | ICD-10-CM | POA: Insufficient documentation

## 2022-02-01 DIAGNOSIS — Z0189 Encounter for other specified special examinations: Secondary | ICD-10-CM

## 2022-02-01 DIAGNOSIS — R059 Cough, unspecified: Secondary | ICD-10-CM

## 2022-02-01 DIAGNOSIS — R000 Tachycardia, unspecified: Secondary | ICD-10-CM | POA: Diagnosis not present

## 2022-02-01 DIAGNOSIS — R0981 Nasal congestion: Secondary | ICD-10-CM | POA: Diagnosis not present

## 2022-02-01 DIAGNOSIS — Z20822 Contact with and (suspected) exposure to covid-19: Secondary | ICD-10-CM | POA: Diagnosis not present

## 2022-02-01 DIAGNOSIS — R0602 Shortness of breath: Secondary | ICD-10-CM

## 2022-02-01 DIAGNOSIS — R053 Chronic cough: Secondary | ICD-10-CM | POA: Diagnosis not present

## 2022-02-01 DIAGNOSIS — R9389 Abnormal findings on diagnostic imaging of other specified body structures: Secondary | ICD-10-CM

## 2022-02-01 LAB — BASIC METABOLIC PANEL
Anion gap: 6 (ref 5–15)
BUN: 11 mg/dL (ref 6–20)
CO2: 27 mmol/L (ref 22–32)
Calcium: 8.9 mg/dL (ref 8.9–10.3)
Chloride: 106 mmol/L (ref 98–111)
Creatinine, Ser: 0.71 mg/dL (ref 0.44–1.00)
GFR, Estimated: >60 mL/min (ref >60)
Glucose, Bld: 91 mg/dL (ref 70–99)
Potassium: 3.3 mmol/L — ABNORMAL LOW (ref 3.5–5.1)
Sodium: 139 mmol/L (ref 135–145)

## 2022-02-01 LAB — CBC WITH DIFFERENTIAL/PLATELET
Abs Immature Granulocytes: 0.03 10*3/uL (ref 0.00–0.07)
Basophils Absolute: 0 10*3/uL (ref 0.0–0.1)
Basophils Relative: 0 %
Eosinophils Absolute: 0.1 10*3/uL (ref 0.0–0.5)
Eosinophils Relative: 1 %
HCT: 44.1 % (ref 36.0–46.0)
Hemoglobin: 14.6 g/dL (ref 12.0–15.0)
Immature Granulocytes: 0 %
Lymphocytes Relative: 27 %
Lymphs Abs: 2.4 10*3/uL (ref 0.7–4.0)
MCH: 30.6 pg (ref 26.0–34.0)
MCHC: 33.1 g/dL (ref 30.0–36.0)
MCV: 92.5 fL (ref 80.0–100.0)
Monocytes Absolute: 1.3 10*3/uL — ABNORMAL HIGH (ref 0.1–1.0)
Monocytes Relative: 15 %
Neutro Abs: 5.1 10*3/uL (ref 1.7–7.7)
Neutrophils Relative %: 57 %
Platelets: 282 10*3/uL (ref 150–400)
RBC: 4.77 MIL/uL (ref 3.87–5.11)
RDW: 13.2 % (ref 11.5–15.5)
WBC: 8.9 10*3/uL (ref 4.0–10.5)
nRBC: 0 % (ref 0.0–0.2)

## 2022-02-01 LAB — RESP PANEL BY RT-PCR (FLU A&B, COVID) ARPGX2
Influenza A by PCR: NEGATIVE
Influenza B by PCR: NEGATIVE
SARS Coronavirus 2 by RT PCR: NEGATIVE

## 2022-02-01 MED ORDER — TRIAMCINOLONE ACETONIDE 55 MCG/ACT NA AERO: 2.0000 | INHALATION_SPRAY | Freq: Every day | NASAL | 12 refills | Status: DC

## 2022-02-01 MED ORDER — DOXYCYCLINE HYCLATE 100 MG PO CAPS: 100.0000 mg | ORAL_CAPSULE | Freq: Two times a day (BID) | ORAL | 0 refills | Status: DC

## 2022-02-01 MED ORDER — SODIUM CHLORIDE 0.9 % IV BOLUS
1000.0000 mL | Freq: Once | INTRAVENOUS | Status: AC
  Administered 2022-02-01: 1000 mL via INTRAVENOUS

## 2022-02-01 MED ORDER — IOHEXOL 300 MG/ML  SOLN
75.0000 mL | Freq: Once | INTRAMUSCULAR | Status: AC | PRN
  Administered 2022-02-01: 75 mL via INTRAVENOUS

## 2022-02-01 MED ORDER — ONDANSETRON HCL 4 MG/2ML IJ SOLN
4.0000 mg | Freq: Once | INTRAMUSCULAR | Status: AC
  Administered 2022-02-01: 4 mg via INTRAVENOUS
  Filled 2022-02-01: qty 2

## 2022-02-01 MED ORDER — LEVALBUTEROL TARTRATE 45 MCG/ACT IN AERO
2.0000 | INHALATION_SPRAY | Freq: Four times a day (QID) | RESPIRATORY_TRACT | Status: DC | PRN
  Administered 2022-02-01: 2 via RESPIRATORY_TRACT
  Filled 2022-02-01: qty 15

## 2022-02-01 MED ORDER — AEROCHAMBER Z-STAT PLUS/MEDIUM MISC: 1.0000 | Freq: Once | Status: DC

## 2022-02-01 NOTE — ED Provider Notes (Addendum)
?[facility] ? ? ?MRN: [id number] DOB: [birth year] ? ?Subjective:  ? ?Jane Wu is a 51 y.o. female presenting for 5-day history of acute onset sinus congestion, persistent coughing, malaise, fatigue. Cough can cause shortness of breath but she doesn't feel this overtly. Has actually had a recurrent cough with intermittent shortness of breath since October 2022. Patient has already undergone a course of methylprednisolone for this episode.  She is currently taking Augmentin for the past 2-3 days.  Has not felt relief from her symptoms.  In the past few months has undergone antibiotic injections, steroid courses and needed trial of an albuterol inhaler which did not help.  No history of asthma.  Patient is non-smoker. No hospitalizations, recent trauma, history of PE, sarcoidosis, clotting disorders. No fever, chest pain.  ? ?No current facility-administered medications for this encounter. ? ?Current Outpatient Medications:  ?  ALPRAZolam (XANAX) 0.25 MG tablet, Take 0.25 mg by mouth 3 (three) times daily as needed., Disp: , Rfl:  ?  amoxicillin-clavulanate (AUGMENTIN) 875-125 MG tablet, SMARTSIG:1 Tablet(s) By Mouth Every 12 Hours, Disp: , Rfl:  ?  atorvastatin (LIPITOR) 20 MG tablet, Take 20 mg by mouth every other day., Disp: , Rfl:  ?  benzonatate (TESSALON) 100 MG capsule, Take 100 mg by mouth 3 (three) times daily., Disp: , Rfl:  ?  cephALEXin (KEFLEX) 500 MG capsule, Take 500 mg by mouth 4 (four) times daily., Disp: , Rfl:  ?  clotrimazole-betamethasone (LOTRISONE) cream, Apply topically 2 (two) times daily. Apply twice daily top soles of feet and under the right breast x2 weeks , and then as apetite , Disp: , Rfl:  ?  cyclobenzaprine (FLEXERIL) 10 MG tablet, Take 10 mg by mouth 2 (two) times daily., Disp: , Rfl:  ?  ibuprofen (ADVIL,MOTRIN) 800 MG tablet, Take 800 mg by mouth 3 (three) times daily. Take 1 tablet by mouth three times a day for 2 days before onset of next menses and for  the first 3 days of the menses , Disp: , Rfl:  ?  levocetirizine (XYZAL) 5 MG tablet, SMARTSIG:1 Tablet(s) By Mouth Every Evening, Disp: , Rfl:   ? ?Allergies  ?Allergen Reactions  ? Bactrim [Sulfamethoxazole-Trimethoprim] Other (See Comments)  ?  " GI upset"  ? Morphine And Related Nausea And Vomiting  ?  "Post surgery vomiting".   ? ? ?Past Medical History:  ?Diagnosis Date  ? Anemia, unspecified   ? Obesity   ?  ? ?Past Surgical History:  ?Procedure Laterality Date  ? CHOLECYSTECTOMY    ? right auxillary dissection for hydradenitis    ? ? ?Family History  ?Problem Relation Age of Onset  ? Hypertension Mother   ?     [place]   ? GER disease Mother   ? ? ?Social History  ? ?Tobacco Use  ? Smoking status: Never  ?Substance Use Topics  ? Alcohol use: No  ? Drug use: No  ? ? ?ROS ? ? ?Objective:  ? ?Vitals: ?BP (!) 132/92   Pulse (!) 108   Temp 98.4 ?F (36.9 ?C)   Resp 18   SpO2 99%  ? ?Pulse recheck ranged from 112 bpm to 116 bpm. ? ?Wt Readings from Last 3 Encounters:  ?12/25/09 187 lb 8 oz (85 kg)  ? ?Temp Readings from Last 3 Encounters:  ?02/01/22 98.4 ?F (36.9 ?C)  ?11/08/21 98.7 ?F (37.1 ?C) (Oral)  ? ?BP Readings from Last 3 Encounters:  ?02/01/22 (!) 132/92  ?11/08/21 (!) 145/90  ?  05/09/16 107/86  ? ?Pulse Readings from Last 3 Encounters:  ?02/01/22 (!) 108  ?11/08/21 99  ?05/09/16 87  ? ?Physical Exam ?Constitutional:   ?   General: She is not in acute distress. ?   Appearance: Normal appearance. She is well-developed. She is not ill-appearing, toxic-appearing or diaphoretic.  ?HENT:  ?   Head: Normocephalic and atraumatic.  ?   Nose: Nose normal.  ?   Mouth/Throat:  ?   Mouth: Mucous membranes are moist.  ?Eyes:  ?   General: No scleral icterus.    ?   Right eye: No discharge.     ?   Left eye: No discharge.  ?   Extraocular Movements: Extraocular movements intact.  ?Cardiovascular:  ?   Rate and Rhythm: Normal rate.  ?   Heart sounds: No murmur heard. ?  No friction rub. No gallop.  ?Pulmonary:  ?    Effort: Pulmonary effort is normal. No respiratory distress.  ?   Breath sounds: No stridor. Examination of the right-lower field reveals decreased breath sounds. Examination of the left-lower field reveals decreased breath sounds. Decreased breath sounds present. No wheezing, rhonchi or rales.  ?Chest:  ?   Chest wall: No tenderness.  ?Skin: ?   General: Skin is warm and dry.  ?Neurological:  ?   General: No focal deficit present.  ?   Mental Status: She is alert and oriented to person, place, and time.  ?Psychiatric:     ?   Mood and Affect: Mood normal.     ?   Behavior: Behavior normal.  ? ?DG Chest 2 View ? ?Result Date: 02/01/2022 ?CLINICAL DATA:  Cough for 1 week. EXAM: CHEST - 2 VIEW COMPARISON:  September 21, 2021 FINDINGS: There is questioned masslike lesion in the left suprahilar region. The mediastinal contour and cardiac silhouette otherwise normal. There is no focal infiltrate, pulmonary edema pleural effusion. Both lungs are clear. The visualized skeletal structures are unremarkable. IMPRESSION: Question masslike lesion in the left suprahilar region. Recommend further evaluation with chest CT. Electronically Signed   By: Sherian Rein M.D.   On: 02/01/2022 09:55   ? ?Assessment and Plan :  ? ?PDMP not reviewed this encounter. ? ?1. Persistent cough   ?2. Encounter for screening for COVID-19   ?3. Patient requested diagnostic testing   ?4. Nasal congestion   ?5. Abnormal chest x-ray   ? ?Discussed chest x-ray findings with patient and I recommended further evaluation through the emergency room including consideration for chest CT scan as recommended by the radiologist.  I consider this appropriate as she is tachycardic, symptomatic and is not having any kind of response to antibiotics, steroids.  Patient will pursue further evaluation and intervention through the emergency room for rule out of an acute cardiopulmonary event. ?  ?  ?Wallis Bamberg, PA-C ?02/01/22 1023 ? ?

## 2022-02-01 NOTE — ED Notes (Signed)
Patient transported to CT 

## 2022-02-01 NOTE — ED Triage Notes (Signed)
Pt endorses cough and congestion. Sent by UC due to abnormal xray and needing CT. Endorses SOB when coughing. Denies CP.  ?

## 2022-02-01 NOTE — Discharge Instructions (Addendum)
Try Nasacort instead of Flonase.  Add doxy to your augmentin.   ? ?It would be better for your health to stay in a non-moldy home. ?

## 2022-02-01 NOTE — Discharge Instructions (Signed)
You have an abnormal chest x-ray with coughing, shortness of breath, tachycardia (fast pulse) that is not responding to antibiotics and steroids. At this stage, the recommendation I have is in line with the radiologist to pursue a chest CT scan. Please head to the hospital/emergency room now for further testing and intervention than we can provide in the urgent care setting.  ?

## 2022-02-01 NOTE — ED Triage Notes (Signed)
Pt presents with cough  and nasal congestion since Tuesday, pt was seen by pcp on Thursday and began prednisone and Augmentin, not feeling better ?

## 2022-02-01 NOTE — ED Provider Notes (Signed)
?[facility] ?Provider Note ? ? ?CSN: [id number] ?Arrival date & time: 02/01/22  1128 ? ?  ? ?History ? ?Chief Complaint  ?Patient presents with  ? Cough  ? ? ?Jane CrazeSenetra M Wu is a 51 y.o. female. ? ?Pt is a 51 yo female with a pmhx significant for obesity and anemia.  Pt has had a persistent cough since October.  She has been on several rounds of abx for sx.  She has also been taking Xyzal at night.  She said she's been staying with her sister since October.  The place is a rental and has trouble with mold.  Pt said they try to clean it, but it comes back.  They have tried to talk to the landlord, but have gotten nowhere.  She is concerned her sx are due to that.  Pt was started on Augmentin a few days ago and cough is worse.  She is bringing up yellow sputum and feels nauseous from it.  She went to UC who sent her here due to an abn cxr. ? ? ?  ? ?Home Medications ?Prior to Admission medications   ?Medication Sig Start Date End Date Taking? Authorizing Provider  ?ALPRAZolam (XANAX) 0.25 MG tablet Take 0.25 mg by mouth 3 (three) times daily as needed for anxiety. 08/10/19  Yes [provider]  ?atorvastatin (LIPITOR) 20 MG tablet Take 20 mg by mouth every other day. 08/26/21  Yes [provider]  ?benzonatate (TESSALON) 100 MG capsule Take 100 mg by mouth 3 (three) times daily as needed for cough. 07/14/19  Yes [provider]  ?cyclobenzaprine (FLEXERIL) 10 MG tablet Take 10 mg by mouth 3 (three) times daily as needed for muscle spasms. 08/12/19  Yes [provider]  ?doxycycline (VIBRAMYCIN) 100 MG capsule Take 1 capsule (100 mg total) by mouth 2 (two) times daily. 02/01/22  Yes Jacalyn LefevreHaviland, Billey Wojciak, MD  ?ibuprofen (ADVIL,MOTRIN) 800 MG tablet Take 800 mg by mouth 3 (three) times daily. Take 1 tablet by mouth three times a day for 2 days before onset of next menses and for the first 3 days of the menses    Yes [provider]  ?levocetirizine (XYZAL) 5 MG  tablet Take 5 mg by mouth every evening. 08/19/21  Yes [provider]  ?omeprazole (PRILOSEC) 20 MG capsule Take 20 mg by mouth daily. 11/25/21  Yes [provider]  ?Pseudoephedrine-Acetaminophen (ALKA-SELTZER PLUS COLD/SINUS PO) Take 2 capsules by mouth every 6 (six) hours as needed (cold/cough).   Yes [provider]  ?triamcinolone (NASACORT) 55 MCG/ACT AERO nasal inhaler Place 2 sprays into the nose daily. 02/01/22  Yes Jacalyn LefevreHaviland, Janos Shampine, MD  ?methylPREDNISolone (MEDROL DOSEPAK) 4 MG TBPK tablet Take by mouth as directed. 01/29/22   [provider]  ?   ? ?Allergies    ?Bactrim [sulfamethoxazole-trimethoprim] and Morphine and related   ? ?Review of Systems   ?Review of Systems  ?HENT:  Positive for congestion.   ?Respiratory:  Positive for cough.   ?All other systems reviewed and are negative. ? ?Physical Exam ?Updated Vital Signs ?BP (!) 120/107   Pulse 99   Temp 97.9 ?F (36.6 ?C) (Oral)   Resp 13   Ht 5\' 4"  (1.626 m)   Wt 87 kg   SpO2 99%   BMI 32.92 kg/m?  ?Physical Exam ?Vitals and nursing note reviewed.  ?Constitutional:   ?   Appearance: Normal appearance. She is obese.  ?HENT:  ?   Head: Normocephalic and  atraumatic.  ?   Right Ear: External ear normal.  ?   Left Ear: External ear normal.  ?   Nose: Nose normal.  ?   Mouth/Throat:  ?   Mouth: Mucous membranes are moist.  ?   Pharynx: Oropharynx is clear.  ?Eyes:  ?   Extraocular Movements: Extraocular movements intact.  ?   Conjunctiva/sclera: Conjunctivae normal.  ?   Pupils: Pupils are equal, round, and reactive to light.  ?Cardiovascular:  ?   Rate and Rhythm: Regular rhythm. Tachycardia present.  ?   Pulses: Normal pulses.  ?   Heart sounds: Normal heart sounds.  ?Pulmonary:  ?   Effort: Pulmonary effort is normal.  ?   Breath sounds: Normal breath sounds.  ?Abdominal:  ?   General: Abdomen is flat. Bowel sounds are normal.  ?   Palpations: Abdomen is soft.  ?Musculoskeletal:     ?   General: Normal range of  motion.  ?   Cervical back: Normal range of motion and neck supple.  ?Skin: ?   General: Skin is warm.  ?   Capillary Refill: Capillary refill takes less than 2 seconds.  ?Neurological:  ?   General: No focal deficit present.  ?   Mental Status: She is alert and oriented to person, place, and time.  ?Psychiatric:     ?   Mood and Affect: Mood normal.     ?   Behavior: Behavior normal.  ? ? ?ED Results / Procedures / Treatments   ?Labs ?(all labs ordered are listed, but only abnormal results are displayed) ?Labs Reviewed  ?BASIC METABOLIC PANEL - Abnormal; Notable for the following components:  ?    Result Value  ? Potassium 3.3 (*)   ? All other components within normal limits  ?CBC WITH DIFFERENTIAL/PLATELET - Abnormal; Notable for the following components:  ? Monocytes Absolute 1.3 (*)   ? All other components within normal limits  ?RESP PANEL BY RT-PCR (FLU A&B, COVID) ARPGX2  ? ? ?EKG ?None ? ?Radiology ?DG Chest 2 View ? ?Result Date: 02/01/2022 ?CLINICAL DATA:  Cough for 1 week. EXAM: CHEST - 2 VIEW COMPARISON:  September 21, 2021 FINDINGS: There is questioned masslike lesion in the left suprahilar region. The mediastinal contour and cardiac silhouette otherwise normal. There is no focal infiltrate, pulmonary edema pleural effusion. Both lungs are clear. The visualized skeletal structures are unremarkable. IMPRESSION: Question masslike lesion in the left suprahilar region. Recommend further evaluation with chest CT. Electronically Signed   By: Sherian Rein M.D.   On: 02/01/2022 09:55  ? ?CT Chest W Contrast ? ?Result Date: 02/01/2022 ?CLINICAL DATA:  Abnormal chest x-ray, possible mass EXAM: CT CHEST WITH CONTRAST TECHNIQUE: Multidetector CT imaging of the chest was performed during intravenous contrast administration. RADIATION DOSE REDUCTION: This exam was performed according to the departmental dose-optimization program which includes automated exposure control, adjustment of the mA and/or kV according to  patient size and/or use of iterative reconstruction technique. CONTRAST:  65mL OMNIPAQUE IOHEXOL 300 MG/ML  SOLN COMPARISON:  None. FINDINGS: Cardiovascular: No significant vascular findings. Normal heart size. No pericardial effusion. Mediastinum/Nodes: No enlarged mediastinal, hilar, or axillary lymph nodes. Thyroid gland, trachea, and esophagus demonstrate no significant findings. Lungs/Pleura: Lungs are clear. No pleural effusion or pneumothorax. Upper Abdomen: No acute abnormality.  Status post cholecystectomy. Musculoskeletal: No chest wall abnormality. No suspicious osseous lesions identified. IMPRESSION: No CT abnormality of the chest to correspond to abnormality queried by prior chest radiograph. No  evidence of mass or lymphadenopathy with specific attention to the left suprahilar region, most likely explained by superimposed pulmonary vessels. Electronically Signed   By: Jearld Lesch M.D.   On: 02/01/2022 13:28   ? ?Procedures ?Procedures  ? ? ?Medications Ordered in ED ?Medications  ?levalbuterol (XOPENEX HFA) inhaler 2 puff (has no administration in time range)  ?aerochamber Z-Stat Plus/medium 1 each (0 each Other Hold 02/01/22 1209)  ?sodium chloride 0.9 % bolus 1,000 mL (1,000 mLs Intravenous New Bag/Given 02/01/22 1205)  ?ondansetron [facility]) injection 4 mg (4 mg Intravenous Given 02/01/22 1205)  ?iohexol (OMNIPAQUE) 300 MG/ML solution 75 mL (75 mLs Intravenous Contrast Given 02/01/22 1303)  ? ? ?ED Course/ Medical Decision Making/ A&P ?  ?                        ?Medical Decision Making ?Amount and/or Complexity of Data Reviewed ?Labs: ordered. ?Radiology: ordered. ? ?Risk ?OTC drugs. ?Prescription drug management. ? ? ?This patient presents to the ED for concern of cough, this involves an extensive number of treatment options, and is a complaint that carries with it a high risk of complications and morbidity.  The differential diagnosis includes pna, bronchitis, uri, covid/flu ? ? ?Co morbidities that  complicate the patient evaluation ? ?none ? ? ?Additional history obtained: ? ?Additional history obtained from epic chart review ? ? ?Lab Tests: ? ?I Ordered, and personally interpreted labs.  The pertinent result

## 2022-02-01 NOTE — ED Notes (Signed)
Per EDP patient needs XOPENEX for discharge. Respiratory called at this time.  ?

## 2022-02-05 LAB — COVID-19, FLU A+B NAA
Influenza A, NAA: NOT DETECTED
Influenza B, NAA: NOT DETECTED
SARS-CoV-2, NAA: NOT DETECTED

## 2022-05-08 ENCOUNTER — Encounter: Payer: Self-pay | Admitting: *Deleted

## 2022-08-05 ENCOUNTER — Encounter: Payer: Self-pay | Admitting: Emergency Medicine

## 2022-08-05 ENCOUNTER — Ambulatory Visit
Admission: EM | Admit: 2022-08-05 | Discharge: 2022-08-05 | Disposition: A | Discharge status: Home / Self Care | Payer: 59 | Attending: Nurse Practitioner | Admitting: Nurse Practitioner

## 2022-08-05 ENCOUNTER — Other Ambulatory Visit: Payer: Self-pay

## 2022-08-05 DIAGNOSIS — J309 Allergic rhinitis, unspecified: Secondary | ICD-10-CM | POA: Diagnosis not present

## 2022-08-05 DIAGNOSIS — H65193 Other acute nonsuppurative otitis media, bilateral: Secondary | ICD-10-CM

## 2022-08-05 MED ORDER — PREDNISONE 20 MG PO TABS: 40.0000 mg | ORAL_TABLET | Freq: Every day | ORAL | 0 refills | Status: AC

## 2022-08-05 MED ORDER — DEXAMETHASONE SODIUM PHOSPHATE 10 MG/ML IJ SOLN
10.0000 mg | INTRAMUSCULAR | Status: AC
  Administered 2022-08-05: 10 mg via INTRAMUSCULAR

## 2022-08-05 NOTE — ED Provider Notes (Signed)
[facility]    CSN: [id number] Arrival date & time: 08/05/22  1802      History   Chief Complaint Chief Complaint  Patient presents with   Ear Pain    HPI LAVERDA STRIBLING is a 51 y.o. female.   The history is provided by the patient.   Patient presents for complaints of bilateral ear pain and pressure and postnasal drainage.  Patient has a long history of allergic rhinitis, she is currently under the care of [facility] allergy, asthma, and immunology services.  Patient states she was scheduled to get an allergy shot, but she took the COVID-vaccine, which pushed back the allergy injection back to November..  She states since that time she has worsening bilateral ear pressure, fatigue, cough, and postnasal drainage.  She states that she has not had fever, chills, headache, sore throat, ear drainage, nausea, vomiting, or diarrhea.  Patient states that she uses a nasal spray, but she has not been using it 3 times daily as prescribed.  She states that she takes Xyzal at bedtime.  Past Medical History:  Diagnosis Date   Anemia, unspecified    Obesity     Patient Active Problem List   Diagnosis Date Noted   Anemia, unspecified    AMENORRHEA 05/27/2010   FATIGUE 05/27/2010   CELLULITIS 12/28/2009   DYSMENORRHEA 03/12/2009   MENORRHAGIA 03/12/2009   HIDRADENITIS SUPPURATIVA 03/12/2009   OBESITY 05/31/2008    Past Surgical History:  Procedure Laterality Date   CHOLECYSTECTOMY     right auxillary dissection for hydradenitis      OB History   No obstetric history on file.      Home Medications    Prior to Admission medications   Medication Sig Start Date End Date Taking? Authorizing Provider  predniSONE (DELTASONE) 20 MG tablet Take 2 tablets (40 mg total) by mouth daily with breakfast for 5 days. 08/05/22 08/10/22 Yes Nikesh Teschner-Warren, Sadie Haber, NP  ALPRAZolam Prudy Feeler) 0.25 MG tablet Take 0.25 mg by mouth 3 (three) times daily as needed for anxiety.  08/10/19   [provider]  atorvastatin (LIPITOR) 20 MG tablet Take 20 mg by mouth every other day. 08/26/21   [provider]  benzonatate (TESSALON) 100 MG capsule Take 100 mg by mouth 3 (three) times daily as needed for cough. 07/14/19   [provider]  cyclobenzaprine (FLEXERIL) 10 MG tablet Take 10 mg by mouth 3 (three) times daily as needed for muscle spasms. 08/12/19   [provider]  doxycycline (VIBRAMYCIN) 100 MG capsule Take 1 capsule (100 mg total) by mouth 2 (two) times daily. 02/01/22   Jacalyn Lefevre, MD  ibuprofen (ADVIL,MOTRIN) 800 MG tablet Take 800 mg by mouth 3 (three) times daily. Take 1 tablet by mouth three times a day for 2 days before onset of next menses and for the first 3 days of the menses     [provider]  levocetirizine (XYZAL) 5 MG tablet Take 5 mg by mouth every evening. 08/19/21   [provider]  methylPREDNISolone (MEDROL DOSEPAK) 4 MG TBPK tablet Take by mouth as directed. 01/29/22   [provider]  omeprazole (PRILOSEC) 20 MG capsule Take 20 mg by mouth daily. 11/25/21   [provider]  Pseudoephedrine-Acetaminophen (ALKA-SELTZER PLUS COLD/SINUS PO) Take 2 capsules by mouth every 6 (six) hours as needed (cold/cough).    [provider]  triamcinolone (NASACORT) 55 MCG/ACT AERO nasal inhaler Place 2 sprays into the nose daily. 02/01/22  Jacalyn Lefevre, MD    Family History Family History  Problem Relation Age of Onset   Hypertension Mother        [place]    GER disease Mother     Social History Social History   Tobacco Use   Smoking status: Never  Substance Use Topics   Alcohol use: No   Drug use: No     Allergies   Bactrim [sulfamethoxazole-trimethoprim] and Morphine and related   Review of Systems Review of Systems Per HPI  Physical Exam Triage Vital Signs ED Triage Vitals  Enc Vitals Group     BP 08/05/22 1917 (!) 138/93     Pulse Rate 08/05/22 1917  86     Resp 08/05/22 1917 20     Temp 08/05/22 1917 98.5 F (36.9 C)     Temp Source 08/05/22 1917 Oral     SpO2 08/05/22 1917 98 %     Weight --      Height --      Head Circumference --      Peak Flow --      Pain Score 08/05/22 1918 7     Pain Loc --      Pain Edu? --      Excl. in GC? --    No data found.  Updated Vital Signs BP (!) 138/93 (BP Location: Right Arm)   Pulse 86   Temp 98.5 F (36.9 C) (Oral)   Resp 20   SpO2 98%   Visual Acuity Right Eye Distance:   Left Eye Distance:   Bilateral Distance:    Right Eye Near:   Left Eye Near:    Bilateral Near:     Physical Exam Vitals and nursing note reviewed.  Constitutional:      General: She is not in acute distress.    Appearance: Normal appearance.  HENT:     Head: Normocephalic.     Right Ear: Ear canal and external ear normal. A middle ear effusion is present.     Left Ear: Ear canal and external ear normal. A middle ear effusion is present.     Ears:      Nose: Congestion present. No rhinorrhea.     Right Turbinates: Enlarged and swollen.     Left Turbinates: Enlarged and swollen.     Right Sinus: No maxillary sinus tenderness or frontal sinus tenderness.     Left Sinus: No maxillary sinus tenderness or frontal sinus tenderness.     Mouth/Throat:     Lips: Pink.     Mouth: Mucous membranes are moist.     Pharynx: Uvula midline. Posterior oropharyngeal erythema present. No oropharyngeal exudate or uvula swelling.  Eyes:     Extraocular Movements: Extraocular movements intact.     Conjunctiva/sclera: Conjunctivae normal.     Pupils: Pupils are equal, round, and reactive to light.  Cardiovascular:     Rate and Rhythm: Normal rate and regular rhythm.     Pulses: Normal pulses.     Heart sounds: Normal heart sounds.  Pulmonary:     Effort: Pulmonary effort is normal.     Breath sounds: Normal breath sounds.  Abdominal:     General: Bowel sounds are normal.     Palpations: Abdomen is soft.   Musculoskeletal:     Cervical back: Normal range of motion.  Lymphadenopathy:     Cervical: No cervical adenopathy.  Skin:    General: Skin is warm and dry.  Neurological:  General: No focal deficit present.     Mental Status: She is alert and oriented to person, place, and time.  Psychiatric:        Mood and Affect: Mood normal.        Behavior: Behavior normal.        Thought Content: Thought content normal.        Judgment: Judgment normal.      UC Treatments / Results  Labs (all labs ordered are listed, but only abnormal results are displayed) Labs Reviewed - No data to display  EKG   Radiology No results found.  Procedures Procedures (including critical care time)  Medications Ordered in UC Medications  dexamethasone (DECADRON) injection 10 mg (10 mg Intramuscular Given 08/05/22 1952)    Initial Impression / Assessment and Plan / UC Course  I have reviewed the triage vital signs and the nursing notes.  Pertinent labs & imaging results that were available during my care of the patient were reviewed by me and considered in my medical decision making (see chart for details).  Patient presents for symptoms of allergic rhinitis.  Patient's vital signs are stable, she is well-appearing and is in no acute distress.  Given her long history of allergic rhinitis and middle ear effusions, Decadron 10 mg IM injection was given along with a prescription for prednisone 40 mg for the next 5 days for the allergic rhinitis and middle ear effusions..  Patient was advised to continue her current allergy regimen, encourage patient to follow-up with her allergist as scheduled in November.  Supportive care recommendations were provided to the patient.  Patient verbalizes understanding which strict return precautions.  Patient is stable for discharge. Final Clinical Impressions(s) / UC Diagnoses   Final diagnoses:  Allergic rhinitis, unspecified seasonality, unspecified trigger   Acute middle ear effusion, bilateral     Discharge Instructions      Take medication as directed. Continue your current allergy medication regimen. Use the nasal spray 3 times daily as prescribed.  Increase fluids and get plenty of rest. May take over-the-counter ibuprofen or Tylenol as needed for pain, fever, or general discomfort. Recommend normal saline nasal spray to help with nasal congestion throughout the day. For your cough, it may be helpful to use a humidifier at bedtime during sleep. Follow-up with your allergist as scheduled in November.  Follow-up as needed.     ED Prescriptions     Medication Sig Dispense Auth. Provider   predniSONE (DELTASONE) 20 MG tablet Take 2 tablets (40 mg total) by mouth daily with breakfast for 5 days. 10 tablet Daneisha Surges-Warren, Alda Lea, NP      PDMP not reviewed this encounter.   Tish Men, NP 08/05/22 2050

## 2022-08-05 NOTE — Discharge Instructions (Addendum)
Take medication as directed. Continue your current allergy medication regimen. Use the nasal spray 3 times daily as prescribed.  Increase fluids and get plenty of rest. May take over-the-counter ibuprofen or Tylenol as needed for pain, fever, or general discomfort. Recommend normal saline nasal spray to help with nasal congestion throughout the day. For your cough, it may be helpful to use a humidifier at bedtime during sleep. Follow-up with your allergist as scheduled in November.  Follow-up as needed.

## 2022-08-05 NOTE — ED Triage Notes (Signed)
Pt reports bilateral ear pain, post nasal drip since Sunday. Pt reports history of similar and denies any known fevers.

## 2022-08-27 ENCOUNTER — Ambulatory Visit: Payer: 59 | Admitting: Family Medicine

## 2022-09-03 ENCOUNTER — Ambulatory Visit: Payer: 59 | Admitting: Family Medicine

## 2022-09-08 ENCOUNTER — Ambulatory Visit (INDEPENDENT_AMBULATORY_CARE_PROVIDER_SITE_OTHER): Payer: 59 | Admitting: Internal Medicine

## 2022-09-08 ENCOUNTER — Encounter: Payer: Self-pay | Admitting: Internal Medicine

## 2022-09-08 VITALS — BP 124/88 | HR 86 | Temp 97.8°F | Resp 18 | Ht 64.0 in | Wt 210.1 lb

## 2022-09-08 DIAGNOSIS — J3089 Other allergic rhinitis: Secondary | ICD-10-CM

## 2022-09-08 DIAGNOSIS — R053 Chronic cough: Secondary | ICD-10-CM | POA: Diagnosis not present

## 2022-09-08 DIAGNOSIS — R058 Other specified cough: Secondary | ICD-10-CM

## 2022-09-08 DIAGNOSIS — H1013 Acute atopic conjunctivitis, bilateral: Secondary | ICD-10-CM | POA: Diagnosis not present

## 2022-09-08 DIAGNOSIS — K219 Gastro-esophageal reflux disease without esophagitis: Secondary | ICD-10-CM

## 2022-09-08 DIAGNOSIS — J302 Other seasonal allergic rhinitis: Secondary | ICD-10-CM

## 2022-09-08 MED ORDER — FLUTICASONE PROPIONATE 50 MCG/ACT NA SUSP: NASAL | 5 refills | Status: DC

## 2022-09-08 MED ORDER — LEVOCETIRIZINE DIHYDROCHLORIDE 5 MG PO TABS: 5.0000 mg | ORAL_TABLET | Freq: Every evening | ORAL | 5 refills | Status: DC

## 2022-09-08 MED ORDER — DUPILUMAB 300 MG/2ML ~~LOC~~ SOSY: 300.0000 mg | PREFILLED_SYRINGE | SUBCUTANEOUS | Status: DC

## 2022-09-08 MED ORDER — AZELASTINE HCL 0.1 % NA SOLN: NASAL | 5 refills | Status: DC

## 2022-09-08 NOTE — Patient Instructions (Addendum)
Chronic Cough - I believe this is related to upper airway/post nasal drainge, spirometry does not show any evidence of obstruction.  Will work on maximizing control of her rhinitis as discussed below.   Rhinitis: - Positive skin test to grasses, weeds, molds - Avoidance measures discussed. - Use nasal saline rinses before nose sprays such as with Neilmed Sinus Rinse over the counter bottle.  Use distilled water.   - Use Flonase 2 sprays each nostril daily. Aim upward and outward. - Use Azelastine 1-2 sprays each nostril twice daily. Aim upward and outward. - Use Ipratroprium 1-2 sprays up to four times daily as needed for runny nose. Aim upward and outward.  - Use Xyzal 5mg  daily.  - Consider allergy shots as long term control of your symptoms by teaching your immune system to be more tolerant of your allergy triggers. Call back if you are interested in allergy shots and we will put the order in and prescribe an Epipen.     GERD - Continue Omeprazole 40mg  daily. -Avoid lying down for at least two hours after a meal or after drinking acidic beverages, like soda, or other caffeinated beverages. This can help to prevent stomach contents from flowing back into the esophagus. -Keep your head elevated while you sleep. Using an extra pillow or two can also help to prevent reflux. -Eat smaller and more frequent meals each day instead of a few large meals. This promotes digestion and can aid in preventing heartburn. -Wear loose-fitting clothes to ease pressure on the stomach, which can worsen heartburn and reflux. -Reduce excess weight around the midsection. This can ease pressure on the stomach. Such pressure can force some stomach contents back up the esophagus.   ALLERGEN AVOIDANCE MEASURES   Molds - Indoor avoidance Use air conditioning to reduce indoor humidity.  Do not use a humidifier. Keep indoor humidity at 30 - 40%.  Use a dehumidifier if needed. In the bathroom use an exhaust fan or  open a window after showering.  Wipe down damp surfaces after showering.  Clean bathrooms with a mold-killing solution (diluted bleach, or products like Tilex, etc) at least once a month. In the kitchen use an exhaust fan to remove steam from cooking.  Throw away spoiled foods immediately, and empty garbage daily.  Empty water pans below self-defrosting refrigerators frequently. Vent the clothes dryer to the outside. Limit indoor houseplants; mold grows in the dirt.  No houseplants in the bedroom. Remove carpet from the bedroom. Encase the mattress and box springs with a zippered encasing.  Molds - Outdoor avoidance Avoid being outside when the grass is being mowed, or the ground is tilled. Avoid playing in leaves, pine straw, hay, etc.  Dead plant materials contain mold. Avoid going into barns or grain storage areas. Remove leaves, clippings and compost from around the home. Pollen Avoidance Pollen levels are highest during the mid-day and afternoon.  Consider this when planning outdoor activities. Avoid being outside when the grass is being mowed, or wear a mask if the pollen-allergic person must be the one to mow the grass. Keep the windows closed to keep pollen outside of the home. Use an air conditioner to filter the air. Take a shower, wash hair, and change clothing after working or playing outdoors during pollen season.

## 2022-09-08 NOTE — Progress Notes (Signed)
NEW PATIENT  Date of Service/Encounter:  09/08/22  Consult requested by: [facility]   Subjective:   Jane Wu (DOB: [birth year]) is a 51 y.o. female who presents to the clinic on 09/08/2022 with a chief complaint of Allergic Rhinitis  (Has had an allergy test at [facility]. Was going to start rush immunotherapy but it was too far of a drive. ), Sinusitis (Recurrent, and has seen and ENT), Other (Ear has been bothering her. Has been on prednisone about 4 times this year ), and Post nasal Drainage .    History obtained from: chart review and patient.   Cough: She has no history of asthma.  Reports having intermittent dry/wet cough usually with post nasal drainage.  She has received multiple courses of antibiotics and steroids this year for this.  She also has developed LE swelling that they think is due to prednisone and are giving her Lasix.  Has had CXR, EKG, CT chest that was normal.  She denies any SOB/wheezing.  She also has GERD that is controlled with Omeprazole. She has an albuterol inhaler to use PRN but has not required it in months.     Rhinitis:  Started around age mid 68s.   Symptoms include:  ear fullness, nasal congestion, rhinorrhea, post nasal drainage, watery eyes, and itchy eyes . Lots of coughing. Recurrent sinus infections requiring prednisone and antibiotics.  She lives with her Mom and there is a lot of mold/mildew.  Occurs year-round Potential triggers: pollen Treatments tried:  Xyzal 5mg  nightly. Last use was Friday. Atrovent PRN Nasacort/Flonase PRN  Previous allergy testing: yes. She was previously allergy tested with [facility].  Reports positivity to couple of pollens and maybe mold. She was going to start AIT but would like to transfer her care to Wednesday as this is closer.   History of reflux/heartburn: yes controlled on Omeprazole 40mg  daily  History of chronic sinusitis or sinus surgery: no Nonallergic  triggers: smoke   Past Medical History: Past Medical History:  Diagnosis Date   Anemia, unspecified    Obesity    Past Surgical History: Past Surgical History:  Procedure Laterality Date   CHOLECYSTECTOMY     right auxillary dissection for hydradenitis      Family History: Family History  Problem Relation Age of Onset   Asthma Mother    Hypertension Mother        [place]    GER disease Mother    Asthma Brother     Social History:  Lives in a unknown year house Flooring in bedroom: wood Pets: none Tobacco use/exposure: none Job: clinic patient care coordinator  Medication List:  Allergies as of 09/08/2022       Reactions   Bactrim [sulfamethoxazole-trimethoprim] Other (See Comments)   " GI upset"   Morphine And Related Nausea And Vomiting   "Post surgery vomiting".         Medication List        Accurate as of September 08, 2022 12:19 PM. If you have any questions, ask your nurse or doctor.          ALKA-SELTZER PLUS COLD/SINUS PO Take 2 capsules by mouth every 6 (six) hours as needed (cold/cough).   ALPRAZolam 0.25 MG tablet Commonly known as: XANAX Take 0.25 mg by mouth 3 (three) times daily as needed for anxiety.   atorvastatin 20 MG tablet Commonly known as: LIPITOR Take 20 mg by mouth every other day.   benzonatate  100 MG capsule Commonly known as: TESSALON Take 100 mg by mouth 3 (three) times daily as needed for cough.   cyclobenzaprine 10 MG tablet Commonly known as: FLEXERIL Take 10 mg by mouth 3 (three) times daily as needed for muscle spasms. 40mg  2x a day   doxycycline 100 MG capsule Commonly known as: VIBRAMYCIN Take 1 capsule (100 mg total) by mouth 2 (two) times daily.   EPINEPHrine 0.3 mg/0.3 mL Soaj injection Commonly known as: EPI-PEN Inject into the muscle.   fluticasone 50 MCG/ACT nasal spray Commonly known as: FLONASE as needed.   furosemide 20 MG tablet Commonly known as: LASIX Take 20 mg by mouth daily.    hydrOXYzine 25 MG capsule Commonly known as: VISTARIL as needed.   ibuprofen 800 MG tablet Commonly known as: ADVIL Take 800 mg by mouth 3 (three) times daily. Take 1 tablet by mouth three times a day for 2 days before onset of next menses and for the first 3 days of the menses   ipratropium 0.03 % nasal spray Commonly known as: ATROVENT SMARTSIG:1-2 Spray(s) Both Nares 1-3 Times Daily PRN   levalbuterol 45 MCG/ACT inhaler Commonly known as: XOPENEX HFA as needed.   levocetirizine 5 MG tablet Commonly known as: XYZAL Take 5 mg by mouth every evening.   methylPREDNISolone 4 MG Tbpk tablet Commonly known as: MEDROL DOSEPAK Take by mouth as directed.   omeprazole 20 MG capsule Commonly known as: PRILOSEC Take 20 mg by mouth daily.   ondansetron 4 MG tablet Commonly known as: ZOFRAN as needed.   triamcinolone 55 MCG/ACT Aero nasal inhaler Commonly known as: NASACORT Place 2 sprays into the nose daily.         REVIEW OF SYSTEMS: Pertinent positives and negatives discussed in HPI.   Objective:   Physical Exam: BP 124/88   Pulse 86   Temp 97.8 F (36.6 C)   Resp 18   Ht 5\' 4"  (1.626 m)   Wt 210 lb 2 oz (95.3 kg)   SpO2 98%   BMI 36.07 kg/m  Body mass index is 36.07 kg/m. GEN: alert, well developed HEENT: clear conjunctiva, TM grey and translucent, nose with + inferior turbinate hypertrophy, pale nasal mucosa, slight clear rhinorrhea, + cobblestoning HEART: regular rate and rhythm, no murmur LUNGS: clear to auscultation bilaterally, no coughing, unlabored respiration ABDOMEN: soft, non distended  SKIN: no rashes or lesions  Reviewed:  Previous Sinus CT 01/14/2022:  Trace mucosal thickening within the left maxillary sinus. The paranasal sinuses are otherwise normally aerated. Patent sinus drainage pathways. Rightward deviation of the nasal septum with rightward bony spurring. Bilateral middle turbinate concha bullosa (more prominent on the left).   Minimal mucosal thickening within the bilateral nasal passages. Asymmetric prominence of the left middle and inferior nasal turbinates, possibly reflecting the nasal cycle.   Spirometry:  Tracings reviewed. Her effort: Variable effort-results affected. FVC: 2.14L FEV1: 1.78L, 75% predicted FEV1/FVC ratio: 83% Interpretation: Spirometry consistent with possible restrictive disease.  Could be effort dependent.   Please see scanned spirometry results for details.  Skin Testing:  Skin prick testing was placed, which includes aeroallergens/foods, histamine control, and saline control.  Verbal consent was obtained prior to placing test.  Patient tolerated procedure well.  Allergy testing results were read and interpreted by myself, documented by clinical staff. Adequate positive and negative control.  Results discussed with patient/family.  Airborne Adult Perc - 09/08/22 1100     Time Antigen Placed 1101    Allergen Manufacturer 01/16/2022  Location Back    Number of Test 59    1. Control-Buffer 50% Glycerol Negative    2. Control-Histamine 1 mg/ml 3+    3. Albumin saline Negative    4. Bahia Negative    5. [place] Negative    6. Johnson Negative    7. Kentucky Blue 3+    8. Meadow Fescue 3+    9. Perennial Rye Negative    10. Sweet Vernal 3+    11. Timothy 3+    12. Cocklebur 3+    13. Burweed Marshelder Negative    14. Ragweed, short 3+    15. Ragweed, Giant 3+    16. Plantain,  English Negative    17. Lamb's Quarters Negative    18. Sheep Sorrell Negative    19. Rough Pigweed Negative    20. Marsh Elder, Rough Negative    21. Mugwort, Common Negative    22. Ash mix Negative    23. Birch mix Negative    24. Beech American Negative    25. Box, Elder Negative    26. Cedar, red Negative    27. Cottonwood, Guinea[place]Biss[place]astern Negative    28. Elm mix Negative    29. Hickory Negative    30. Maple mix Negative    31. Oak, Guinea[place]iss[place]astern mix Negative    32. Pecan Pollen Negative    33. Pine  mix Negative    34. Sycamore Eastern Negative    35. Walnut, Black Pollen Negative    36. Alternaria alternata 3+    37. Cladosporium Herbarum 3+    38. Aspergillus mix 3+    39. Penicillium mix 3+    40. Bipolaris sorokiniana (Helminthosporium) Negative    41. Drechslera spicifera (Curvularia) 3+    42. Mucor plumbeus Negative    43. Fusarium moniliforme Negative    44. Aureobasidium pullulans (pullulara) Negative    45. Rhizopus oryzae Negative    46. Botrytis cinera Negative    47. Epicoccum nigrum Negative    48. Phoma betae Negative    49. Candida Albicans Negative    50. Trichophyton mentagrophytes 3+    51. Mite, D Farinae  5,000 AU/ml Negative    52. Mite, D Pteronyssinus  5,000 AU/ml Negative    53. Cat Hair 10,000 BAU/ml Negative    54.  Dog Epithelia Negative    55. Mixed Feathers Negative    56. Horse Epithelia Negative    57. Cockroach, German Negative    58. Mouse Negative    59. Tobacco Leaf Negative             Intradermal - 09/08/22 1127     Time Antigen Placed 1127    Allergen Manufacturer Waynette ButteryGreer    Location Arm    Number of Test 9               Assessment:   1. Seasonal and perennial allergic rhinitis   2. Chronic cough   3. Upper airway cough syndrome   4. Allergic conjunctivitis of both eyes   5. Gastroesophageal reflux disease, unspecified whether esophagitis present     Plan/Recommendations:  Chronic Cough - I believe this is related to upper airway/post nasal drainge, spirometry does not show any evidence of obstruction.  Will work on maximizing control of her rhinitis as discussed below.   Allergic Rhinitis Allergic Conjunctivitis - Positive skin test 08/2022 to grasses, weeds, molds.   Retested today to our full allergen panel as she would like to start  AIT with [place]. - Avoidance measures discussed. - Use nasal saline rinses before nose sprays such as with Neilmed Sinus Rinse over the counter bottle.  Use distilled water.   -  Use Flonase 2 sprays each nostril daily. Aim upward and outward. - Use Azelastine 1-2 sprays each nostril twice daily. Aim upward and outward. - Use Ipratroprium 1-2 sprays up to four times daily as needed for runny nose. Aim upward and outward.  - Use Xyzal 5mg  daily.  - Consider allergy shots as long term control of your symptoms by teaching your immune system to be more tolerant of your allergy triggers. Call back if you are interested in allergy shots and we will put the order in and prescribe an Epipen.     GERD - Continue Omeprazole 40mg  daily. -Avoid lying down for at least two hours after a meal or after drinking acidic beverages, like soda, or other caffeinated beverages. This can help to prevent stomach contents from flowing back into the esophagus. -Keep your head elevated while you sleep. Using an extra pillow or two can also help to prevent reflux. -Eat smaller and more frequent meals each day instead of a few large meals. This promotes digestion and can aid in preventing heartburn. -Wear loose-fitting clothes to ease pressure on the stomach, which can worsen heartburn and reflux. -Reduce excess weight around the midsection. This can ease pressure on the stomach. Such pressure can force some stomach contents back up the esophagus.   Return in about 6 weeks (around 10/20/2022).  , MD [facility]

## 2022-09-09 NOTE — Addendum Note (Signed)
Addended by: Elsworth Soho on: 09/09/2022 01:03 PM   Modules accepted: Orders

## 2022-09-16 ENCOUNTER — Other Ambulatory Visit (HOSPITAL_COMMUNITY): Payer: Self-pay

## 2022-09-16 MED ORDER — OMEPRAZOLE 40 MG PO CPDR
40.0000 mg | DELAYED_RELEASE_CAPSULE | Freq: Two times a day (BID) | ORAL | 5 refills | Status: AC
  Filled 2022-09-16: qty 60, 30d supply, fill #0
  Filled 2023-06-26: qty 60, 30d supply, fill #1
  Filled 2023-08-04: qty 60, 30d supply, fill #2

## 2022-09-16 MED ORDER — ATORVASTATIN CALCIUM 20 MG PO TABS
20.0000 mg | ORAL_TABLET | Freq: Every day | ORAL | 1 refills | Status: DC
  Filled 2022-09-16: qty 30, 30d supply, fill #0
  Filled 2022-12-31 (×2): qty 30, 30d supply, fill #1

## 2022-09-26 ENCOUNTER — Ambulatory Visit: Payer: BC Managed Care – PPO | Admitting: Allergy & Immunology

## 2022-09-30 ENCOUNTER — Encounter: Payer: Self-pay | Admitting: Internal Medicine

## 2022-09-30 ENCOUNTER — Ambulatory Visit: Payer: 59 | Admitting: Internal Medicine

## 2022-09-30 ENCOUNTER — Other Ambulatory Visit: Payer: Self-pay

## 2022-09-30 VITALS — BP 122/70 | HR 93 | Temp 98.6°F | Resp 16 | Wt 211.7 lb

## 2022-09-30 DIAGNOSIS — J302 Other seasonal allergic rhinitis: Secondary | ICD-10-CM

## 2022-09-30 DIAGNOSIS — K219 Gastro-esophageal reflux disease without esophagitis: Secondary | ICD-10-CM

## 2022-09-30 DIAGNOSIS — J3089 Other allergic rhinitis: Secondary | ICD-10-CM | POA: Diagnosis not present

## 2022-09-30 DIAGNOSIS — R058 Other specified cough: Secondary | ICD-10-CM | POA: Diagnosis not present

## 2022-09-30 DIAGNOSIS — H1013 Acute atopic conjunctivitis, bilateral: Secondary | ICD-10-CM | POA: Diagnosis not present

## 2022-09-30 MED ORDER — FLUTICASONE PROPIONATE 50 MCG/ACT NA SUSP
NASAL | 5 refills | Status: DC
  Filled 2022-11-17: qty 16, 30d supply, fill #0

## 2022-09-30 MED ORDER — LEVOCETIRIZINE DIHYDROCHLORIDE 5 MG PO TABS
5.0000 mg | ORAL_TABLET | Freq: Every evening | ORAL | 5 refills | Status: DC
  Filled 2022-11-17: qty 30, 30d supply, fill #0

## 2022-09-30 MED ORDER — AZELASTINE HCL 0.1 % NA SOLN
NASAL | 5 refills | Status: DC
  Filled 2022-11-17: qty 30, 30d supply, fill #0

## 2022-09-30 NOTE — Progress Notes (Signed)
   FOLLOW UP Date of Service/Encounter:  09/30/22   Subjective:  Jane Wu (DOB: [birth year]) is a 51 y.o. female [facility] for follow up for allergic rhinoconjunctivitis, GERD, chronic cough.   History obtained from: chart review and patient.  Since last visit, she reports she did better for a few days but then her symptoms came back. She is still having a lot of post nasal drainage that causes her to cough.  Also has some congestion and anterior rhinorrhea. Not much ocular symptoms.  She also recently saw her PCP for this and because she had some mild sore throat and chills.  She was COVID/flu negative and was started on doxycycline. She is taking Azelastine twice daily and Xyzal 5mg  daily. She is interested in AIT.   She reports having some reflux symptoms since last visit.  She is taking omeprazole daily.  She isn't following the lifestyle measures.    Past Medical History: Past Medical History:  Diagnosis Date   Anemia, unspecified    Obesity     Objective:  BP 122/70   Pulse 93   Temp 98.6 F (37 C) (Temporal)   Resp 16   Wt 211 lb 11.2 oz (96 kg)   SpO2 99%   BMI 36.34 kg/m  Body mass index is 36.34 kg/m. Physical Exam: GEN: alert, well developed HEENT: clear conjunctiva, TM grey and translucent, nose with moderate inferior turbinate hypertrophy, pink nasal mucosa, clear rhinorrhea, + cobblestoning HEART: regular rate and rhythm, no murmur LUNGS: clear to auscultation bilaterally, no coughing, unlabored respiration SKIN: no rashes or lesions  Assessment/Plan  Allergic Rhinitis Allergic Conjunctivitis Chronic Cough with Post Nasal Drainage  - Positive skin test 08/2022: grasses, weeds, molds - Avoidance measures discussed. - Use nasal saline rinses before nose sprays such as with Neilmed Sinus Rinse over the counter bottle.  Use distilled water.   - Use Flonase 2 sprays each nostril daily. Aim upward and  outward. - Use Azelastine 1-2 sprays each nostril twice daily. Aim upward and outward. - Use Xyzal 5mg  daily.  - Consider allergy shots as long term control of your symptoms by teaching your immune system to be more tolerant of your allergy triggers. Call 09/2022 back if you are interested in allergy shots after discussing the cost with your insurance company and we will put the order for the vials to be mixed and prescribe an Epipen.     GERD - Continue Omeprazole 40mg  daily. Discussed importance of lifestyle measurements.   -Avoid lying down for at least two hours after a meal or after drinking acidic beverages, like soda, or other caffeinated beverages. This can help to prevent stomach contents from flowing back into the esophagus. -Keep your head elevated while you sleep. Using an extra pillow or two can also help to prevent reflux. -Eat smaller and more frequent meals each day instead of a few large meals. This promotes digestion and can aid in preventing heartburn. -Wear loose-fitting clothes to ease pressure on the stomach, which can worsen heartburn and reflux. -Reduce excess weight around the midsection. This can ease pressure on the stomach. Such pressure can force some stomach contents back up the esophagus.     Return in about 3 months (around 12/30/2022). [place], MD  [facility]

## 2022-09-30 NOTE — Patient Instructions (Addendum)
Allergic Rhinitis Allergic Conjunctivitis Chronic Cough with Post Nasal Drainage  - Positive skin test 08/2022: grasses, weeds, molds - Avoidance measures discussed. - Use nasal saline rinses before nose sprays such as with Neilmed Sinus Rinse over the counter bottle.  Use distilled water.   - Use Flonase 2 sprays each nostril daily. Aim upward and outward. - Use Azelastine 1-2 sprays each nostril twice daily. Aim upward and outward. - Use Xyzal 5mg  daily.  - Consider allergy shots as long term control of your symptoms by teaching your immune system to be more tolerant of your allergy triggers. Call back if you are interested in allergy shots after discussing the cost with your insurance company and we will put the order for the vials to be mixed and prescribe an Epipen.     GERD - Continue Omeprazole 40mg  daily. -Avoid lying down for at least two hours after a meal or after drinking acidic beverages, like soda, or other caffeinated beverages. This can help to prevent stomach contents from flowing back into the esophagus. -Keep your head elevated while you sleep. Using an extra pillow or two can also help to prevent reflux. -Eat smaller and more frequent meals each day instead of a few large meals. This promotes digestion and can aid in preventing heartburn. -Wear loose-fitting clothes to ease pressure on the stomach, which can worsen heartburn and reflux. -Reduce excess weight around the midsection. This can ease pressure on the stomach. Such pressure can force some stomach contents back up the esophagus.

## 2022-10-07 ENCOUNTER — Encounter (HOSPITAL_BASED_OUTPATIENT_CLINIC_OR_DEPARTMENT_OTHER): Payer: Self-pay

## 2022-10-07 DIAGNOSIS — R0683 Snoring: Secondary | ICD-10-CM

## 2022-10-14 ENCOUNTER — Ambulatory Visit: Payer: 59 | Admitting: Family Medicine

## 2022-10-28 ENCOUNTER — Encounter: Payer: Self-pay | Admitting: *Deleted

## 2022-11-03 ENCOUNTER — Ambulatory Visit
Admission: EM | Admit: 2022-11-03 | Discharge: 2022-11-03 | Disposition: A | Discharge status: Home / Self Care | Payer: Commercial Managed Care - PPO | Attending: Nurse Practitioner | Admitting: Nurse Practitioner

## 2022-11-03 ENCOUNTER — Telehealth: Payer: Self-pay

## 2022-11-03 ENCOUNTER — Encounter (HOSPITAL_BASED_OUTPATIENT_CLINIC_OR_DEPARTMENT_OTHER): Payer: Commercial Managed Care - PPO | Admitting: Internal Medicine

## 2022-11-03 ENCOUNTER — Ambulatory Visit (INDEPENDENT_AMBULATORY_CARE_PROVIDER_SITE_OTHER): Payer: Commercial Managed Care - PPO

## 2022-11-03 DIAGNOSIS — R112 Nausea with vomiting, unspecified: Secondary | ICD-10-CM | POA: Diagnosis not present

## 2022-11-03 DIAGNOSIS — R1032 Left lower quadrant pain: Secondary | ICD-10-CM | POA: Insufficient documentation

## 2022-11-03 LAB — POCT URINALYSIS DIP (MANUAL ENTRY)
Bilirubin, UA: NEGATIVE
Blood, UA: NEGATIVE
Glucose, UA: NEGATIVE mg/dL
Ketones, POC UA: NEGATIVE mg/dL
Leukocytes, UA: NEGATIVE
Nitrite, UA: NEGATIVE
Spec Grav, UA: 1.025 (ref 1.010–1.025)
Urobilinogen, UA: 1 E.U./dL
pH, UA: 6 (ref 5.0–8.0)

## 2022-11-03 LAB — POCT URINE PREGNANCY: Preg Test, Ur: NEGATIVE

## 2022-11-03 MED ORDER — IBUPROFEN 800 MG PO TABS: 800.0000 mg | ORAL_TABLET | Freq: Three times a day (TID) | ORAL | 0 refills | Status: AC

## 2022-11-03 MED ORDER — KETOROLAC TROMETHAMINE 30 MG/ML IJ SOLN
30.0000 mg | Freq: Once | INTRAMUSCULAR | Status: AC
  Administered 2022-11-03: 30 mg via INTRAMUSCULAR

## 2022-11-03 MED ORDER — TAMSULOSIN HCL 0.4 MG PO CAPS: 0.4000 mg | ORAL_CAPSULE | Freq: Every day | ORAL | 0 refills | Status: DC

## 2022-11-03 MED ORDER — IBUPROFEN 800 MG PO TABS: 800.0000 mg | ORAL_TABLET | Freq: Three times a day (TID) | ORAL | 0 refills | Status: DC

## 2022-11-03 MED ORDER — ONDANSETRON HCL 4 MG PO TABS: 4.0000 mg | ORAL_TABLET | Freq: Three times a day (TID) | ORAL | 0 refills | Status: DC | PRN

## 2022-11-03 NOTE — ED Triage Notes (Addendum)
Lower abdominal, nausea/vomiting, lower back pain, slight burning when urinate, urinary frequency and urgency. Pt requested a urine pregnancy test due to no period since October to rule out pre menopause or pregnancy.

## 2022-11-03 NOTE — ED Provider Notes (Signed)
[facility]    CSN: [id number] Arrival date & time: 11/03/22  1702      History   Chief Complaint Chief Complaint  Patient presents with   Urinary Frequency    HPI Jane Wu is a 52 y.o. female.   The history is provided by the patient.    Past Medical History:  Diagnosis Date   Anemia, unspecified    Obesity    Patient presents with a 2-day history of lower left quadrant abdominal pain with nausea and vomiting, left-sided low back pain, urinary frequency, urgency, and dysuria.  Patient denies decreased urine stream, hematuria, injury, trauma, constipation, or diarrhea.  Patient reports that she does have a history of kidney stones and that it has been quite sometime since she has had 1.  She states that she has been taking medication for her symptoms such as ibuprofen which gives her relief.  She states that the pain worsens when she is going from a sitting to standing position.  Denies history of pyelonephritis.  Patient states her last menstrual cycle was in October.  Patient Active Problem List   Diagnosis Date Noted   Anemia, unspecified    AMENORRHEA 05/27/2010   FATIGUE 05/27/2010   CELLULITIS 12/28/2009   DYSMENORRHEA 03/12/2009   MENORRHAGIA 03/12/2009   HIDRADENITIS SUPPURATIVA 03/12/2009   OBESITY 05/31/2008    Past Surgical History:  Procedure Laterality Date   CHOLECYSTECTOMY     right auxillary dissection for hydradenitis      OB History   No obstetric history on file.      Home Medications    Prior to Admission medications   Medication Sig Start Date End Date Taking? Authorizing Provider  ALPRAZolam (XANAX) 0.25 MG tablet Take 0.25 mg by mouth 3 (three) times daily as needed for anxiety. 08/10/19  Yes [provider]  atorvastatin (LIPITOR) 20 MG tablet Take 20 mg by mouth every other day. 08/26/21  Yes [provider]  atorvastatin (LIPITOR) 20 MG tablet Take 1 tablet (20 mg total) by mouth daily for high  cholesterol 09/16/22  Yes   azelastine (ASTELIN) 0.1 % nasal spray 1-2 sprays each nostril twice daily. Aim upward and outward 09/30/22  Yes Patel, Odette Horns, MD  benzonatate (TESSALON) 100 MG capsule Take 100 mg by mouth 3 (three) times daily as needed for cough. 07/14/19  Yes [provider]  cyclobenzaprine (FLEXERIL) 10 MG tablet Take 10 mg by mouth 3 (three) times daily as needed for muscle spasms. 40mg  2x a day 08/12/19  Yes [provider]  fluticasone (FLONASE) 50 MCG/ACT nasal spray 2 sprays each nostril daily. Aim upward and outward. 09/30/22  Yes Larose Kells, MD  furosemide (LASIX) 20 MG tablet Take 20 mg by mouth daily. 08/18/22  Yes [provider]  ipratropium (ATROVENT) 0.03 % nasal spray SMARTSIG:1-2 Spray(s) Both Nares 1-3 Times Daily PRN   Yes [provider]  levalbuterol (XOPENEX HFA) 45 MCG/ACT inhaler as needed. 02/21/22  Yes [provider]  levocetirizine (XYZAL) 5 MG tablet Take 1 tablet (5 mg total) by mouth every evening. 09/30/22  Yes Larose Kells, MD  omeprazole (PRILOSEC) 20 MG capsule Take 20 mg by mouth daily. 11/25/21  Yes [provider]  omeprazole (PRILOSEC) 40 MG capsule Take 1 capsule (40 mg total) by mouth 2 (two) times daily 30 minutes before morning and evening meals 08/28/22  Yes   doxycycline (VIBRAMYCIN) 100 MG capsule Take 1 capsule (100 mg total) by  mouth 2 (two) times daily. 02/01/22   Jacalyn Lefevre, MD  EPINEPHrine 0.3 mg/0.3 mL IJ SOAJ injection Inject into the muscle. Patient not taking: Reported on 09/08/2022    [provider]  hydrOXYzine (VISTARIL) 25 MG capsule as needed. 12/28/19   [provider]  ibuprofen (ADVIL) 800 MG tablet Take 1 tablet (800 mg total) by mouth 3 (three) times daily. 11/03/22   Ellison Leisure-Warren, Sadie Haber, NP  methylPREDNISolone (MEDROL DOSEPAK) 4 MG TBPK tablet Take by mouth as directed. Patient not taking: Reported on 09/08/2022 01/29/22   [provider]  ondansetron (ZOFRAN) 4 MG tablet Take 1 tablet (4 mg total) by mouth every 8 (eight) hours as needed for nausea or vomiting. 11/03/22   Raschelle Wisenbaker-Warren, Sadie Haber, NP  Pseudoephedrine-Acetaminophen (ALKA-SELTZER PLUS COLD/SINUS PO) Take 2 capsules by mouth every 6 (six) hours as needed (cold/cough).    [provider]  tamsulosin (FLOMAX) 0.4 MG CAPS capsule Take 1 capsule (0.4 mg total) by mouth daily after supper. 11/03/22   Jaser Fullen-Warren, Sadie Haber, NP  triamcinolone (NASACORT) 55 MCG/ACT AERO nasal inhaler Place 2 sprays into the nose daily. 02/01/22   Jacalyn Lefevre, MD    Family History Family History  Problem Relation Age of Onset   Asthma Mother    Hypertension Mother        [name]    GER disease Mother    Asthma Brother     Social History Social History   Tobacco Use   Smoking status: Never  Substance Use Topics   Alcohol use: No   Drug use: No     Allergies   Bactrim [sulfamethoxazole-trimethoprim] and Morphine and related   Review of Systems Review of Systems Per HPI  Physical Exam Triage Vital Signs ED Triage Vitals  Enc Vitals Group     BP 11/03/22 1739 132/89     Pulse Rate 11/03/22 1739 92     Resp 11/03/22 1739 18     Temp 11/03/22 1739 98.6 F (37 C)     Temp Source 11/03/22 1739 Oral     SpO2 11/03/22 1739 99 %     Weight --      Height --      Head Circumference --      Peak Flow --      Pain Score 11/03/22 1743 8     Pain Loc --      Pain Edu? --      Excl. in GC? --    No data found.  Updated Vital Signs BP 132/89 (BP Location: Right Arm)   Pulse 92   Temp 98.6 F (37 C) (Oral)   Resp 18   SpO2 99%   Visual Acuity Right Eye Distance:   Left Eye Distance:   Bilateral Distance:    Right Eye Near:   Left Eye Near:    Bilateral Near:     Physical Exam Vitals and nursing note reviewed.  Constitutional:      General: She is not in acute distress.    Appearance: Normal appearance.  HENT:     Head:  Normocephalic.  Cardiovascular:     Rate and Rhythm: Regular rhythm.     Pulses: Normal pulses.     Heart sounds: Normal heart sounds.  Pulmonary:     Effort: Pulmonary effort is normal.     Breath sounds: Normal breath sounds.  Abdominal:     General: Bowel sounds are normal.     Palpations: Abdomen is soft.  Tenderness: There is abdominal tenderness in the left lower quadrant. There is left CVA tenderness. There is no right CVA tenderness.  Musculoskeletal:     Cervical back: Normal range of motion.  Lymphadenopathy:     Cervical: No cervical adenopathy.  Skin:    General: Skin is warm and dry.  Neurological:     General: No focal deficit present.     Mental Status: She is alert and oriented to person, place, and time.  Psychiatric:        Mood and Affect: Mood normal.        Behavior: Behavior normal.      UC Treatments / Results  Labs (all labs ordered are listed, but only abnormal results are displayed) Labs Reviewed  POCT URINALYSIS DIP (MANUAL ENTRY) - Abnormal; Notable for the following components:      Result Value   Protein Ur, POC trace (*)    All other components within normal limits  URINE CULTURE  POCT URINE PREGNANCY    EKG   Radiology DG Abd 2 Views  Result Date: 11/03/2022 CLINICAL DATA:  Left lower quadrant pain EXAM: ABDOMEN - 2 VIEW COMPARISON:  None FINDINGS: Prior cholecystectomy. Nonobstructive bowel gas pattern. No organomegaly or free air. No suspicious calcification. IMPRESSION: But no acute findings. Electronically Signed   By: Rolm Baptise M.D.   On: 11/03/2022 18:23    Procedures Procedures (including critical care time)  Medications Ordered in UC Medications  ketorolac (TORADOL) 30 MG/ML injection 30 mg (30 mg Intramuscular Given 11/03/22 1844)    Initial Impression / Assessment and Plan / UC Course  I have reviewed the triage vital signs and the nursing notes.  Pertinent labs & imaging results that were available during my  care of the patient were reviewed by me and considered in my medical decision making (see chart for details).  The patient is well-appearing, she is in no acute distress, vital signs are stable.  Suspect a possible kidney stone given the patient's history.  Urinalysis was positive for protein only at this time.  Abdominal x-ray was also negative for possible calcification, urine pregnancy was also negative..  Will have patient start tamsulosin 0.4 mg, increase her water intake, and take ibuprofen 800 mg for pain or discomfort.  Her nausea and vomiting, ondansetron 4 mg was also prescribed.  Supportive care recommendations were provided to the patient to include increasing her water intake, and straining her urine.  A urine culture is pending.  Patient was advised she will be contacted if the culture results are positive.  Patient verbalizes understanding.  All questions were answered.  Patient stable for discharge.  Work note was provided.   Final Clinical Impressions(s) / UC Diagnoses   Final diagnoses:  Abdominal pain, left lower quadrant  Nausea and vomiting, unspecified vomiting type     Discharge Instructions      The x-ray did not show any suspicion for a kidney stone.  As discussed, an x-ray is not the recommended modality to determine whether or not a kidney stone is present. The urine pregnancy test was negative.  Your urinalysis was positive for protein.  No suspicion for urinary tract infection at this time. A urine culture is pending.  You will be contacted if the culture results are positive. Take medication as prescribed. Recommend increasing your water intake.  Try to drink at least 10-12 eight ounce glasses of water while symptoms persist. I am providing a urine strainer for you to use each  time you void.  Try strain your urine each time to see if you passed a stone. If symptoms do not improve, please go to the emergency department for further evaluation or if you develop  worsening abdominal pain, nausea, vomiting, or other concerns. Follow-up as needed.      ED Prescriptions     Medication Sig Dispense Auth. Provider   tamsulosin (FLOMAX) 0.4 MG CAPS capsule  (Status: Discontinued) Take 1 capsule (0.4 mg total) by mouth daily after supper. 30 capsule Elaysia Devargas-Warren, Sadie Haber, NP   ibuprofen (ADVIL) 800 MG tablet  (Status: Discontinued) Take 1 tablet (800 mg total) by mouth 3 (three) times daily. 21 tablet Tima Curet-Warren, Sadie Haber, NP   ondansetron (ZOFRAN) 4 MG tablet  (Status: Discontinued) Take 1 tablet (4 mg total) by mouth every 8 (eight) hours as needed for nausea or vomiting. 20 tablet Kiah Vanalstine-Warren, Sadie Haber, NP   ondansetron (ZOFRAN) 4 MG tablet Take 1 tablet (4 mg total) by mouth every 8 (eight) hours as needed for nausea or vomiting. 20 tablet Quartez Lagos-Warren, Sadie Haber, NP   tamsulosin (FLOMAX) 0.4 MG CAPS capsule Take 1 capsule (0.4 mg total) by mouth daily after supper. 30 capsule Conway Fedora-Warren, Sadie Haber, NP   ibuprofen (ADVIL) 800 MG tablet Take 1 tablet (800 mg total) by mouth 3 (three) times daily. 21 tablet Martesha Niedermeier-Warren, Sadie Haber, NP      PDMP not reviewed this encounter.   Abran Cantor, NP 11/03/22 1850

## 2022-11-03 NOTE — Discharge Instructions (Addendum)
The x-ray did not show any suspicion for a kidney stone.  As discussed, an x-ray is not the recommended modality to determine whether or not a kidney stone is present. The urine pregnancy test was negative.  Your urinalysis was positive for protein.  No suspicion for urinary tract infection at this time. A urine culture is pending.  You will be contacted if the culture results are positive. Take medication as prescribed. Recommend increasing your water intake.  Try to drink at least 10-12 eight ounce glasses of water while symptoms persist. I am providing a urine strainer for you to use each time you void.  Try strain your urine each time to see if you passed a stone. If symptoms do not improve, please go to the emergency department for further evaluation or if you develop worsening abdominal pain, nausea, vomiting, or other concerns. Follow-up as needed.

## 2022-11-04 LAB — URINE CULTURE: Culture: NO GROWTH

## 2022-11-05 DIAGNOSIS — N134 Hydroureter: Secondary | ICD-10-CM | POA: Diagnosis not present

## 2022-11-05 DIAGNOSIS — N132 Hydronephrosis with renal and ureteral calculous obstruction: Secondary | ICD-10-CM | POA: Diagnosis not present

## 2022-11-05 DIAGNOSIS — R1084 Generalized abdominal pain: Secondary | ICD-10-CM | POA: Diagnosis not present

## 2022-11-05 DIAGNOSIS — R110 Nausea: Secondary | ICD-10-CM | POA: Diagnosis not present

## 2022-11-05 DIAGNOSIS — N200 Calculus of kidney: Secondary | ICD-10-CM | POA: Diagnosis not present

## 2022-11-05 DIAGNOSIS — N23 Unspecified renal colic: Secondary | ICD-10-CM | POA: Diagnosis not present

## 2022-11-05 DIAGNOSIS — I959 Hypotension, unspecified: Secondary | ICD-10-CM | POA: Diagnosis not present

## 2022-11-05 DIAGNOSIS — R109 Unspecified abdominal pain: Secondary | ICD-10-CM | POA: Diagnosis not present

## 2022-11-05 DIAGNOSIS — R8271 Bacteriuria: Secondary | ICD-10-CM | POA: Diagnosis not present

## 2022-11-12 DIAGNOSIS — N202 Calculus of kidney with calculus of ureter: Secondary | ICD-10-CM | POA: Diagnosis not present

## 2022-11-17 ENCOUNTER — Ambulatory Visit: Payer: Self-pay | Admitting: Internal Medicine

## 2022-11-17 ENCOUNTER — Other Ambulatory Visit (HOSPITAL_COMMUNITY): Payer: Self-pay

## 2022-11-17 MED ORDER — CYCLOBENZAPRINE HCL 10 MG PO TABS
10.0000 mg | ORAL_TABLET | Freq: Every day | ORAL | 4 refills | Status: DC | PRN
  Filled 2022-11-17: qty 30, 30d supply, fill #0

## 2022-11-17 MED ORDER — LOSARTAN POTASSIUM 25 MG PO TABS
25.0000 mg | ORAL_TABLET | Freq: Every day | ORAL | 11 refills | Status: DC
  Filled 2022-11-17: qty 30, 30d supply, fill #0

## 2022-11-17 MED ORDER — FUROSEMIDE 20 MG PO TABS
ORAL_TABLET | ORAL | 0 refills | Status: DC
  Filled 2022-11-17: qty 15, 15d supply, fill #0

## 2022-11-17 MED ORDER — OMEPRAZOLE 40 MG PO CPDR
40.0000 mg | DELAYED_RELEASE_CAPSULE | Freq: Two times a day (BID) | ORAL | 1 refills | Status: DC
  Filled 2022-11-17: qty 30, 15d supply, fill #0

## 2022-11-27 DIAGNOSIS — J309 Allergic rhinitis, unspecified: Secondary | ICD-10-CM | POA: Diagnosis not present

## 2022-12-02 DIAGNOSIS — N201 Calculus of ureter: Secondary | ICD-10-CM | POA: Diagnosis not present

## 2022-12-02 DIAGNOSIS — D259 Leiomyoma of uterus, unspecified: Secondary | ICD-10-CM | POA: Diagnosis not present

## 2022-12-02 DIAGNOSIS — N200 Calculus of kidney: Secondary | ICD-10-CM | POA: Diagnosis not present

## 2022-12-02 DIAGNOSIS — R8271 Bacteriuria: Secondary | ICD-10-CM | POA: Diagnosis not present

## 2022-12-02 DIAGNOSIS — K5730 Diverticulosis of large intestine without perforation or abscess without bleeding: Secondary | ICD-10-CM | POA: Diagnosis not present

## 2022-12-05 ENCOUNTER — Ambulatory Visit: Payer: Commercial Managed Care - PPO | Admitting: Internal Medicine

## 2022-12-05 ENCOUNTER — Other Ambulatory Visit: Payer: Self-pay

## 2022-12-05 ENCOUNTER — Encounter: Payer: Self-pay | Admitting: Internal Medicine

## 2022-12-05 VITALS — BP 124/80 | HR 94 | Temp 98.3°F | Resp 18 | Wt 204.6 lb

## 2022-12-05 DIAGNOSIS — J3089 Other allergic rhinitis: Secondary | ICD-10-CM | POA: Diagnosis not present

## 2022-12-05 DIAGNOSIS — R058 Other specified cough: Secondary | ICD-10-CM | POA: Diagnosis not present

## 2022-12-05 DIAGNOSIS — J069 Acute upper respiratory infection, unspecified: Secondary | ICD-10-CM

## 2022-12-05 DIAGNOSIS — K219 Gastro-esophageal reflux disease without esophagitis: Secondary | ICD-10-CM

## 2022-12-05 DIAGNOSIS — J302 Other seasonal allergic rhinitis: Secondary | ICD-10-CM

## 2022-12-05 MED ORDER — FLUTICASONE PROPIONATE 50 MCG/ACT NA SUSP: NASAL | 5 refills | Status: DC

## 2022-12-05 MED ORDER — AZELASTINE HCL 0.1 % NA SOLN: NASAL | 5 refills | Status: DC

## 2022-12-05 MED ORDER — LEVOCETIRIZINE DIHYDROCHLORIDE 5 MG PO TABS: 5.0000 mg | ORAL_TABLET | Freq: Every evening | ORAL | 5 refills | Status: DC

## 2022-12-05 NOTE — Progress Notes (Signed)
FOLLOW UP Date of Service/Encounter:  12/05/22   Subjective:  Jane Wu (DOB: Oct 06, 1971) is a 52 y.o. female who returns to the [facility]22 for follow up for allergic rhinoconjunctivitis and GERD.  History obtained from: chart review and patient. Last visit was with me 09/30/2022 and was SPT positive for grasses, weeds, molds. Started on Flonase, Azelastine, Xyzal and given info on AIT.   Since last visit, she reports having to go to her primary care due to post nasal drainage, congestion, no fevers, some chills, fatigue and ear pressure.  Told her it was a viral infection and to do Allegra D in AM and Xyzal in PM.  Since then, she reports doing better but still has a lot of ear pressure and drainage.  She is using Flonase daily but not the Azelastine. She has not started the nasal rinses.  Also needs FMLA for intermittent leave for appointments.  She also has had a lot of congestion and drainage outside of this infection.   Her GERD is doing okay as long as she takes the omeprazole.  Denies much issues with heart burn or regurgitation.   Past Medical History: Past Medical History:  Diagnosis Date   Anemia, unspecified    Obesity     Objective:  BP 124/80   Pulse 94   Temp 98.3 F (36.8 C) (Temporal)   Resp 18   Wt 204 lb 9.6 oz (92.8 kg)   SpO2 99%   BMI 35.12 kg/m  Body mass index is 35.12 kg/m. Physical Exam: GEN: alert, well developed HEENT: clear conjunctiva, TM with slight haziness but no erythema or bulging,  ose with moderate inferior turbinate hypertrophy, pink nasal mucosa, + clear rhinorrhea, + cobblestoning HEART: regular rate and rhythm, no murmur LUNGS: clear to auscultation bilaterally, no coughing, unlabored respiration SKIN: no rashes or lesions   Assessment:   1. Seasonal and perennial allergic rhinitis   2. Upper airway cough syndrome   3. Gastroesophageal reflux disease, unspecified whether esophagitis present    4. Viral URI     Plan/Recommendations:   Allergic Rhinitis Allergic Conjunctivitis Chronic Cough with Post Nasal Drainage  - Uncontrolled discussed to add INAH and nasal saline rinses and consider AIT.  - Positive skin test 08/2022: grasses, weeds, molds - Avoidance measures discussed. - Use nasal saline rinses before nose sprays such as with Neilmed Sinus Rinse over the counter bottle.  Use distilled water.   - Use Flonase 2 sprays each nostril daily. Aim upward and outward. - Use Azelastine 1-2 sprays each nostril twice daily. Aim upward and outward. - Use Xyzal 38m daily.  - Consider allergy shots as long term control of your symptoms by teaching your immune system to be more tolerant of your allergy triggers. Call [facility] if you are interested in allergy shots after discussing the cost with your insurance company and we will put the order for the vials to be mixed and prescribe an Epipen.   - Will fill out FMLA for intermittent leave for appointments and fax it back.    GERD - Continue Omeprazole 49mdaily. -Avoid lying down for at least two hours after a meal or after drinking acidic beverages, like soda, or other caffeinated beverages. This can help to prevent stomach contents from flowing back into the esophagus. -Keep your head elevated while you sleep. Using an extra pillow or two can also help to prevent reflux. -Eat smaller and more frequent meals each day  instead of a few large meals. This promotes digestion and can aid in preventing heartburn. -Wear loose-fitting clothes to ease pressure on the stomach, which can worsen heartburn and reflux. -Reduce excess weight around the midsection. This can ease pressure on the stomach. Such pressure can force some stomach contents back up the esophagus.    Viral Upper Respiratory Infection  - Symptoms improving. Antibiotics only work on bacterial infections. Because URIs usually are caused by viruses, antibiotics are not an effective  treatment.   Nasal congestion, post nasal dripping, or sinus pressure:   Nasal sprays, such as oxymetazoline (Afrin), can help relieve nasal discomfort, congestion, and/or pressure. Decongestant sprays should not be used longer than three consecutive days.   Nasal rinsing with saline nasal spray or salt water (e.g., Neilmed Sinus Rinse bottle) can help relieve nasal dryness.   Use a warm mist humidifier or take a steamy shower to increase moisture in the air and soothe oral and nasal tissues that become irritated with dry air.   Sore throat:  Use a pain reliever, such as acetaminophen (Tylenol) or ibuprofen (Advil).   Gargle with salt water (1/4 tsp of salt dissolved in 8 oz of warm water). Salt water can be used as often as you like.   Throat lozenges or throat sprays (Cepacol lozenges or Chloroseptic spray) may bring some relief by increasing saliva production and lubricating your throat.   Drink plenty of fluids (e.g., water, diluted juice, tea) to soothe your throat and to stay well hydrated.     Return in about 3 months (around 03/05/2023).  Harlon Flor, MD [facility]

## 2022-12-05 NOTE — Patient Instructions (Addendum)
Allergic Rhinitis Allergic Conjunctivitis Chronic Cough with Post Nasal Drainage  - Positive skin test 08/2022: grasses, weeds, molds - Avoidance measures discussed. - Use nasal saline rinses before nose sprays such as with Neilmed Sinus Rinse over the counter bottle.  Use distilled water.   - Use Flonase 2 sprays each nostril daily. Aim upward and outward. - Use Azelastine 1-2 sprays each nostril twice daily. Aim upward and outward. - Use Xyzal 79m daily.  - Consider allergy shots as long term control of your symptoms by teaching your immune system to be more tolerant of your allergy triggers. Call [facility] if you are interested in allergy shots after discussing the cost with your insurance company and we will put the order for the vials to be mixed and prescribe an Epipen.   - Will fill out FMLA for intermittent leave for appointments and fax it back.    GERD - Continue Omeprazole 460mdaily. -Avoid lying down for at least two hours after a meal or after drinking acidic beverages, like soda, or other caffeinated beverages. This can help to prevent stomach contents from flowing back into the esophagus. -Keep your head elevated while you sleep. Using an extra pillow or two can also help to prevent reflux. -Eat smaller and more frequent meals each day instead of a few large meals. This promotes digestion and can aid in preventing heartburn. -Wear loose-fitting clothes to ease pressure on the stomach, which can worsen heartburn and reflux. -Reduce excess weight around the midsection. This can ease pressure on the stomach. Such pressure can force some stomach contents back up the esophagus.    Viral Upper Respiratory Infections Antibiotics only work on bacterial infections. Because URIs usually are caused by viruses, antibiotics are not an effective treatment.   Nasal congestion, post nasal dripping, or sinus pressure:   Nasal sprays, such as oxymetazoline (Afrin), can help relieve nasal  discomfort, congestion, and/or pressure. Decongestant sprays should not be used longer than three consecutive days.   Nasal rinsing with saline nasal spray or salt water (e.g., Neilmed Sinus Rinse bottle) can help relieve nasal dryness.   Use a warm mist humidifier or take a steamy shower to increase moisture in the air and soothe oral and nasal tissues that become irritated with dry air.   Sore throat:  Use a pain reliever, such as acetaminophen (Tylenol) or ibuprofen (Advil).   Gargle with salt water (1/4 tsp of salt dissolved in 8 oz of warm water). Salt water can be used as often as you like.   Throat lozenges or throat sprays (Cepacol lozenges or Chloroseptic spray) may bring some relief by increasing saliva production and lubricating your throat.   Drink plenty of fluids (e.g., water, diluted juice, tea) to soothe your throat and to stay well hydrated.    ALLERGEN AVOIDANCE MEASURES  Molds - Indoor avoidance Use air conditioning to reduce indoor humidity.  Do not use a humidifier. Keep indoor humidity at 30 - 40%.  Use a dehumidifier if needed. In the bathroom use an exhaust fan or open a window after showering.  Wipe down damp surfaces after showering.  Clean bathrooms with a mold-killing solution (diluted bleach, or products like Tilex, etc) at least once a month. In the kitchen use an exhaust fan to remove steam from cooking.  Throw away spoiled foods immediately, and empty garbage daily.  Empty water pans below self-defrosting refrigerators frequently. Vent the clothes dryer to the outside. Limit indoor houseplants; mold grows in the dirt.  No houseplants in the bedroom. Remove carpet from the bedroom. Encase the mattress and box springs with a zippered encasing.  Molds - Outdoor avoidance Avoid being outside when the grass is being mowed, or the ground is tilled. Avoid playing in leaves, pine straw, hay, etc.  Dead plant materials contain mold. Avoid going into barns or grain  storage areas. Remove leaves, clippings and compost from around the home. Pollen Avoidance Pollen levels are highest during the mid-day and afternoon.  Consider this when planning outdoor activities. Avoid being outside when the grass is being mowed, or wear a mask if the pollen-allergic person must be the one to mow the grass. Keep the windows closed to keep pollen outside of the home. Use an air conditioner to filter the air. Take a shower, wash hair, and change clothing after working or playing outdoors during pollen season.

## 2022-12-08 ENCOUNTER — Telehealth: Payer: Self-pay

## 2022-12-08 ENCOUNTER — Telehealth: Payer: Self-pay | Admitting: Internal Medicine

## 2022-12-08 DIAGNOSIS — J309 Allergic rhinitis, unspecified: Secondary | ICD-10-CM

## 2022-12-08 NOTE — Telephone Encounter (Signed)
Called patient - DOB verified - advised the $25 fee to have FMLA paperwork processed.  Patient stated if provider can add routine office visits and flare ups to the form.  Patient advised message will be forwarded to provider.  Patient was transferred to front staff to pay fee.  Patient verbalized understanding, no further questions.

## 2022-12-08 NOTE — Telephone Encounter (Signed)
I collected $25.00 for FMLA paperwork from this patient on 12/08/22

## 2022-12-11 NOTE — Telephone Encounter (Signed)
Updated FMLA paperwork has been faxed to [employer] management.  Copy sent to [employer] and copy be held to ensure [employer] received fax.

## 2022-12-15 NOTE — Telephone Encounter (Signed)
Called Dom Massa/Claims [employer]er [employer] - [phone] ext. [phone] - LMOVM advising no medical certification form was faxed for provider to complete.

## 2022-12-22 ENCOUNTER — Telehealth: Payer: Self-pay

## 2022-12-22 NOTE — Telephone Encounter (Signed)
Please refer to 12/08/22 telephone note.

## 2022-12-22 NOTE — Telephone Encounter (Signed)
Patient stopped by office - patient states [employer] is requesting for the intermittent leave in part C to be corrected. [employer] is requesting for specific dates to be placed instead of "11-__-23 to current" [employer] is requesting for the dates to be "09-08-22 to 09-09-2023"   Best contact number: [phone]

## 2022-12-22 NOTE — Telephone Encounter (Addendum)
Called patient - DOB verified - advised messaged would be forwarded to provider regarding dates [employer]  is requesting.  Patient changed f/u appt to tomorrow 12/23/22 @ 10:45 w/provider to discuss FMLA changes.  Forwarding message to provider as update.

## 2022-12-23 ENCOUNTER — Other Ambulatory Visit: Payer: Self-pay

## 2022-12-23 ENCOUNTER — Encounter: Payer: Self-pay | Admitting: Internal Medicine

## 2022-12-23 ENCOUNTER — Other Ambulatory Visit (HOSPITAL_COMMUNITY): Payer: Self-pay

## 2022-12-23 ENCOUNTER — Ambulatory Visit: Payer: Commercial Managed Care - PPO | Admitting: Internal Medicine

## 2022-12-23 VITALS — BP 126/84 | HR 87 | Temp 98.0°F | Resp 18 | Ht 64.0 in | Wt 204.2 lb

## 2022-12-23 DIAGNOSIS — K219 Gastro-esophageal reflux disease without esophagitis: Secondary | ICD-10-CM

## 2022-12-23 DIAGNOSIS — J3089 Other allergic rhinitis: Secondary | ICD-10-CM | POA: Diagnosis not present

## 2022-12-23 DIAGNOSIS — J302 Other seasonal allergic rhinitis: Secondary | ICD-10-CM

## 2022-12-23 DIAGNOSIS — R058 Other specified cough: Secondary | ICD-10-CM

## 2022-12-23 MED ORDER — EPINEPHRINE 0.3 MG/0.3ML IJ SOAJ
0.3000 mg | INTRAMUSCULAR | 1 refills | Status: DC | PRN
  Filled 2022-12-23: qty 2, 30d supply, fill #0
  Filled 2023-01-20: qty 2, 1d supply, fill #0

## 2022-12-23 MED ORDER — FEXOFENADINE HCL 180 MG PO TABS
180.0000 mg | ORAL_TABLET | Freq: Every day | ORAL | 5 refills | Status: DC
  Filled 2022-12-23: qty 30, 30d supply, fill #0

## 2022-12-23 MED ORDER — MONTELUKAST SODIUM 10 MG PO TABS
10.0000 mg | ORAL_TABLET | Freq: Every day | ORAL | 5 refills | Status: DC
  Filled 2022-12-23: qty 30, 30d supply, fill #0

## 2022-12-23 NOTE — Addendum Note (Signed)
Addended byHarlon Flor on: 12/23/2022 12:21 PM   Modules accepted: Orders

## 2022-12-23 NOTE — Patient Instructions (Addendum)
Allergic Rhinitis Allergic Conjunctivitis Chronic Cough with Post Nasal Drainage  - Positive skin test 08/2022: grasses, weeds, molds - Avoidance measures discussed. - Use nasal saline rinses before nose sprays such as with Neilmed Sinus Rinse over the counter bottle.  Use distilled water.   - Use Flonase 2 sprays each nostril daily. Aim upward and outward. - Use Azelastine 1-2 sprays each nostril twice daily. Aim upward and outward. - Use Xyzal '5mg'$  daily in PM.  Use Allegra '180mg'$  daily in AM as needed and definitely on the days you get your allergy shot.   - Continue Singulair '10mg'$  daily.  - Start AIT and Bring Epipen with you at each shot visit.    GERD -Continue Omeprazole '40mg'$  daily. -Avoid lying down for at least two hours after a meal or after drinking acidic beverages, like soda, or other caffeinated beverages. This can help to prevent stomach contents from flowing back into the esophagus. -Keep your head elevated while you sleep. Using an extra pillow or two can also help to prevent reflux. -Eat smaller and more frequent meals each day instead of a few large meals. This promotes digestion and can aid in preventing heartburn. -Wear loose-fitting clothes to ease pressure on the stomach, which can worsen heartburn and reflux. -Reduce excess weight around the midsection. This can ease pressure on the stomach. Such pressure can force some stomach contents back up the esophagus.

## 2022-12-23 NOTE — Progress Notes (Addendum)
FOLLOW UP Date of Service/Encounter:  12/23/22   Subjective:  Jane Wu (DOB: [birth year]) is a 52 y.o. female who returns to the [facility] [place] on 12/23/2022 for follow up for allergic rhinoconjunctivitis, GERD, cough with PND.   History obtained from: chart review and patient. Reports discussing with insurance company regarding AIT since last visit and that it would be covered. She wishes to start that now because she had another flare up of her sinus issues and had to take leftover doxycycline which did help.  Still having intermittent congestion, drainage, ear pressure.  Using Xyzal in PM.  Not doing nasal rinses or the nose sprays regularly (Flonase/Azelastine).  Also gets cough due to the drainage. Also needs a refill on Singulair; states she was on this in the past when she saw [facility].   Of note, she has seen E[facility] [place] PA on 01/14/2022 in the past and had essentially normal CT sinus (trace mucosal thickening of L maxillary sinus and R deviation of septum, bl concha bullosa) and declined fiberoptic exam; there was concern for reflux so was started on Omeprazole.   Reflux doing okay with omeprazole. It does worsen if she stops it.    Past Medical History: Past Medical History:  Diagnosis Date   Anemia, unspecified    Asthma    Obesity     Objective:  BP 126/84   Pulse 87   Temp 98 F (36.7 C)   Resp 18   Ht '5\' 4"'$  (1.626 m)   Wt 204 lb 3.2 oz (92.6 kg)   SpO2 98%   BMI 35.05 kg/m  Body mass index is 35.05 kg/m. Physical Exam: GEN: alert, well developed HEENT: clear conjunctiva, R TM with slight haziness but no erythema, bulging and L TM grey/translucent,  nose with moderate inferior turbinate hypertrophy, pink nasal mucosa, clear rhinorrhea, + cobblestoning HEART: regular rate and rhythm, no murmur LUNGS: clear to auscultation bilaterally, no coughing, unlabored respiration SKIN: no rashes or lesions  Reviewed: Of note, she has seen ENT  [place] PA on 01/14/2022 in the past and had essentially normal CT sinus (trace mucosal thickening of L maxillary sinus and R deviation of septum, bl concha bullosa) and declined fiberoptic exam; there was concern for reflux so was started on Omeprazole.   Assessment:   1. Seasonal and perennial allergic rhinitis   2. Upper airway cough syndrome   3. Gastroesophageal reflux disease, unspecified whether esophagitis present     Plan/Recommendations:  Allergic Rhinitis/Allergic Conjunctivitis Chronic Cough with Post Nasal Drainage  - Uncontrolled despite medical management, planning to start AIT.  Has a visit scheduled and consent signed.  I will place the order today.  Discussed need for compliance with regimen discussed below as AIT does not provide instantaneous benefits.  - Positive skin test 08/2022: grasses, weeds, molds - Avoidance measures discussed. - Use nasal saline rinses before nose sprays such as with Neilmed Sinus Rinse over the counter bottle.  Use distilled water.   - Use Flonase 2 sprays each nostril daily. Aim upward and outward. - Use Azelastine 1-2 sprays each nostril twice daily. Aim upward and outward. - Use Xyzal '5mg'$  daily in PM.  Use Allegra '180mg'$  daily in AM as needed and definitely on the days you get your allergy shot.   - Continue Singulair '10mg'$  daily.  - Start AIT and Bring Epipen with you at each shot visit.    GERD -Controlled with daily PPI.  Continue Omeprazole '40mg'$  daily. -Avoid  lying down for at least two hours after a meal or after drinking acidic beverages, like soda, or other caffeinated beverages. This can help to prevent stomach contents from flowing back into the esophagus. -Keep your head elevated while you sleep. Using an extra pillow or two can also help to prevent reflux. -Eat smaller and more frequent meals each day instead of a few large meals. This promotes digestion and can aid in preventing heartburn. -Wear loose-fitting clothes to ease  pressure on the stomach, which can worsen heartburn and reflux. -Reduce excess weight around the midsection. This can ease pressure on the stomach. Such pressure can force some stomach contents back up the esophagus.     Return in about 2 months (around 02/22/2023).  Harlon Flor, MD [facility]

## 2022-12-24 DIAGNOSIS — J301 Allergic rhinitis due to pollen: Secondary | ICD-10-CM | POA: Diagnosis not present

## 2022-12-24 NOTE — Progress Notes (Signed)
VIALS EXP 12-24-23

## 2022-12-24 NOTE — Progress Notes (Signed)
Aeroallergen Immunotherapy   Ordering Provider: Harlon Flor   Patient Details  Name: Jane Wu  MRN: [id number]  Date of Birth: [birth year]   Order 2 of 2   Vial Label: mold   0.2 ml (Volume)  1:20 Concentration -- Alternaria alternata  0.2 ml (Volume)  1:20 Concentration -- Cladosporium herbarum  0.2 ml (Volume)  1:10 Concentration -- Aspergillus mix  0.2 ml (Volume)  1:10 Concentration -- Penicillium mix  0.2 ml (Volume)  1:20 Concentration -- Drechslera spicifera  0.2 ml (Volume)  1:10 Concentration -- Fusarium moniliforme  0.2 ml (Volume)  1:40 Concentration -- Aureobasidium pullulans  0.2 ml (Volume)  1:10 Concentration -- Rhizopus oryzae     1.6  ml Extract Subtotal  3.4  ml Diluent  5.0  ml Maintenance Total   Schedule:  B  Silver Vial (1:1,000,000): Schedule B (6 doses)  Blue Vial (1:100,000): Schedule B (6 doses)  Yellow Vial (1:10,000): Schedule B (6 doses)  Green Vial (1:1,000): Schedule B (6 doses)  Red Vial (1:100): Schedule A (10 doses)   Special Instructions: Bring Epipen

## 2022-12-24 NOTE — Progress Notes (Signed)
Aeroallergen Immunotherapy   Ordering Provider: Harlon Flor   Patient Details  Name: Jane Wu  MRN: [id number]  Date of Birth: Oct 03, 1971   Order 1 of 2   Vial Label: grasses, weeds   0.3 ml (Volume)  BAU Concentration -- 7 Grass Mix* 100,000 ([address] [place], [place], [place], Perennial [place], [place], Sweet [place], [place])  0.3 ml (Volume)  1:20 Concentration -- Ragweed Mix  0.2 ml (Volume)  1:20 Concentration -- Cocklebur    0.8  ml Extract Subtotal  4.2  ml Diluent  5.0  ml Maintenance Total   Schedule:  B  Silver Vial (1:1,000,000): Schedule B (6 doses)  Blue Vial (1:100,000): Schedule B (6 doses)  Yellow Vial (1:10,000): Schedule B (6 doses)  Green Vial (1:1,000): Schedule B (6 doses)  Red Vial (1:100): Schedule A (10 doses)   Special Instructions: Bring Epipen

## 2022-12-25 DIAGNOSIS — J302 Other seasonal allergic rhinitis: Secondary | ICD-10-CM | POA: Diagnosis not present

## 2022-12-29 ENCOUNTER — Ambulatory Visit: Payer: Commercial Managed Care - PPO | Admitting: Family Medicine

## 2022-12-30 ENCOUNTER — Ambulatory Visit: Payer: Commercial Managed Care - PPO | Admitting: Internal Medicine

## 2022-12-31 ENCOUNTER — Other Ambulatory Visit (HOSPITAL_COMMUNITY): Payer: Self-pay

## 2023-01-01 ENCOUNTER — Other Ambulatory Visit (HOSPITAL_COMMUNITY): Payer: Self-pay

## 2023-01-05 DIAGNOSIS — S134XXA Sprain of ligaments of cervical spine, initial encounter: Secondary | ICD-10-CM | POA: Diagnosis not present

## 2023-01-05 DIAGNOSIS — M7501 Adhesive capsulitis of right shoulder: Secondary | ICD-10-CM | POA: Diagnosis not present

## 2023-01-05 DIAGNOSIS — S233XXA Sprain of ligaments of thoracic spine, initial encounter: Secondary | ICD-10-CM | POA: Diagnosis not present

## 2023-01-12 DIAGNOSIS — S134XXA Sprain of ligaments of cervical spine, initial encounter: Secondary | ICD-10-CM | POA: Diagnosis not present

## 2023-01-12 DIAGNOSIS — S233XXA Sprain of ligaments of thoracic spine, initial encounter: Secondary | ICD-10-CM | POA: Diagnosis not present

## 2023-01-12 DIAGNOSIS — M7501 Adhesive capsulitis of right shoulder: Secondary | ICD-10-CM | POA: Diagnosis not present

## 2023-01-15 ENCOUNTER — Telehealth: Payer: Self-pay

## 2023-01-15 DIAGNOSIS — J329 Chronic sinusitis, unspecified: Secondary | ICD-10-CM | POA: Diagnosis not present

## 2023-01-15 NOTE — Telephone Encounter (Signed)
Suzzanne Cloud, NP/[facility] called in - DOB verified - wanted to know AIT process due patient being on antibiotics - whether or not provider would be okay with patient being on antibiotic.  Barnett Applebaum was advised  - the discretion is up to her - reviewed AIT process and protocol if patient is sick - sometime injections are rescheduled until patient is feeling better.  Gina verbalized understanding, no further questions.

## 2023-01-20 ENCOUNTER — Telehealth: Payer: Self-pay | Admitting: Internal Medicine

## 2023-01-20 ENCOUNTER — Other Ambulatory Visit (HOSPITAL_COMMUNITY): Payer: Self-pay

## 2023-01-20 ENCOUNTER — Encounter: Payer: Self-pay | Admitting: Internal Medicine

## 2023-01-20 NOTE — Telephone Encounter (Signed)
Jane Wu came into the office a little upset that she hadn't heard anything about her phone call message that she placed earlier today (01/20/2023).  When I looked back, She was concerned about Montelukast having a black box warning and wanted to know if there was an alternate medication.  The message was taken and routed to [place] instead of Dr. Posey Pronto.  Greenleigh also wants to know if she should obtain the Chest X-ray before she receives her injection on 4/3.  If so, she states she will need the order put in.  I did ask Dr. Neldon Mc since he was still here about the Montelukast just to ease her mind.  I did inform Sarahann of Dr. Bruna Potter response and he stated that she didn't need to take the medication and that the class of medication montelukast is, there is not alternate.  I did tell Quintina that I would still send Dr. Posey Pronto the question anyway.  Please advise.

## 2023-01-20 NOTE — Telephone Encounter (Signed)
Patient called stating there is a black box warning on the medication montelukast (SINGULAIR) 10 MG tablet [id number] and patient does not want to take this medication asking for another option please advise

## 2023-01-21 ENCOUNTER — Other Ambulatory Visit: Payer: Self-pay | Admitting: Internal Medicine

## 2023-01-21 ENCOUNTER — Ambulatory Visit (INDEPENDENT_AMBULATORY_CARE_PROVIDER_SITE_OTHER): Payer: Commercial Managed Care - PPO

## 2023-01-21 ENCOUNTER — Ambulatory Visit: Payer: Commercial Managed Care - PPO

## 2023-01-21 ENCOUNTER — Telehealth: Payer: Self-pay | Admitting: Internal Medicine

## 2023-01-21 ENCOUNTER — Ambulatory Visit: Payer: Commercial Managed Care - PPO | Admitting: Family Medicine

## 2023-01-21 ENCOUNTER — Other Ambulatory Visit (HOSPITAL_COMMUNITY): Payer: Self-pay

## 2023-01-21 ENCOUNTER — Encounter: Payer: Self-pay | Admitting: Internal Medicine

## 2023-01-21 DIAGNOSIS — J309 Allergic rhinitis, unspecified: Secondary | ICD-10-CM | POA: Diagnosis not present

## 2023-01-21 MED ORDER — LEVALBUTEROL TARTRATE 45 MCG/ACT IN AERO: 1.0000 | INHALATION_SPRAY | Freq: Four times a day (QID) | RESPIRATORY_TRACT | 0 refills | Status: DC | PRN

## 2023-01-21 NOTE — Telephone Encounter (Signed)
Jane Wu would like refills of Xopenex.  We had not prescribed this to her before that I can see.   Please advise.  She would liek this sent to the [facility].

## 2023-01-21 NOTE — Progress Notes (Signed)
Immunotherapy   Patient Details  Name: Jane Wu MRN: [id number] Date of Birth: July 03, 1971  01/21/2023  [name] started injections for  mold and grass-weed  Following schedule: B  Frequency:1 time per week Epi-Pen:Epi-Pen Available  Consent signed and patient instructions given.  Patient waited in the 30 minutes in office. She made sure to bring her epi-pen and took her Allegra this morning. Patient had no issues with her allergy injections.   Larence Penning 01/21/2023, 10:08 AM

## 2023-01-21 NOTE — Progress Notes (Signed)
Xopenex refilled.

## 2023-01-23 ENCOUNTER — Other Ambulatory Visit (HOSPITAL_COMMUNITY): Payer: Self-pay

## 2023-01-27 ENCOUNTER — Encounter: Payer: Self-pay | Admitting: Family Medicine

## 2023-01-28 ENCOUNTER — Ambulatory Visit (INDEPENDENT_AMBULATORY_CARE_PROVIDER_SITE_OTHER): Payer: Commercial Managed Care - PPO

## 2023-01-28 DIAGNOSIS — J309 Allergic rhinitis, unspecified: Secondary | ICD-10-CM | POA: Diagnosis not present

## 2023-02-04 ENCOUNTER — Ambulatory Visit (INDEPENDENT_AMBULATORY_CARE_PROVIDER_SITE_OTHER): Payer: Commercial Managed Care - PPO

## 2023-02-04 DIAGNOSIS — J309 Allergic rhinitis, unspecified: Secondary | ICD-10-CM | POA: Diagnosis not present

## 2023-02-06 ENCOUNTER — Other Ambulatory Visit (HOSPITAL_COMMUNITY): Payer: Self-pay

## 2023-02-06 ENCOUNTER — Other Ambulatory Visit: Payer: Self-pay

## 2023-02-06 ENCOUNTER — Encounter: Payer: Self-pay | Admitting: Internal Medicine

## 2023-02-06 ENCOUNTER — Ambulatory Visit (INDEPENDENT_AMBULATORY_CARE_PROVIDER_SITE_OTHER): Payer: Commercial Managed Care - PPO | Admitting: Internal Medicine

## 2023-02-06 VITALS — BP 138/96 | HR 90 | Temp 97.9°F | Wt 203.9 lb

## 2023-02-06 DIAGNOSIS — J4520 Mild intermittent asthma, uncomplicated: Secondary | ICD-10-CM

## 2023-02-06 DIAGNOSIS — R053 Chronic cough: Secondary | ICD-10-CM | POA: Diagnosis not present

## 2023-02-06 DIAGNOSIS — J302 Other seasonal allergic rhinitis: Secondary | ICD-10-CM | POA: Diagnosis not present

## 2023-02-06 DIAGNOSIS — J3089 Other allergic rhinitis: Secondary | ICD-10-CM

## 2023-02-06 MED ORDER — FLUTICASONE PROPIONATE 50 MCG/ACT NA SUSP
NASAL | 5 refills | Status: DC
  Filled 2023-02-06: qty 16, 30d supply, fill #0
  Filled 2023-03-10: qty 16, 30d supply, fill #1

## 2023-02-06 MED ORDER — LEVOCETIRIZINE DIHYDROCHLORIDE 5 MG PO TABS
5.0000 mg | ORAL_TABLET | Freq: Every evening | ORAL | 5 refills | Status: DC
  Filled 2023-02-06: qty 30, 30d supply, fill #0
  Filled 2023-03-10: qty 30, 30d supply, fill #1

## 2023-02-06 MED ORDER — BENZONATATE 100 MG PO CAPS
100.0000 mg | ORAL_CAPSULE | Freq: Three times a day (TID) | ORAL | 0 refills | Status: DC | PRN
  Filled 2023-02-06: qty 15, 5d supply, fill #0

## 2023-02-06 MED ORDER — AZELASTINE HCL 0.1 % NA SOLN
NASAL | 5 refills | Status: DC
  Filled 2023-02-06: qty 30, 50d supply, fill #0

## 2023-02-06 MED ORDER — ALLEGRA ALLERGY CHILDRENS 30 MG/5ML PO SUSP
30.0000 mg | Freq: Every day | ORAL | 5 refills | Status: DC
  Filled 2023-02-06: qty 237, 47d supply, fill #0

## 2023-02-06 NOTE — Progress Notes (Signed)
FOLLOW UP Date of Service/Encounter:  02/06/23   Subjective:  Jane Wu (DOB: Mar 10, 1971) is a 52 y.o. female who [facility] for follow up for an acute flare up.  History obtained from: chart review and patient. Last visit was with me 12/23/2022 for uncontrolled rhinitis, started on AIT.  Since last visit, she has started AIT.  Reports the past few days, feels like she is coming down with a sinus infection.  She had some doxycycline lying around but decided not to take it.  Has a aching pressure like pain in sinuses with drainage,congestion and feeling fatigue.  Using Azelastine and Saline rinses, stopped Flonase.  Taking Xyzal in PM.  Not taking Allegra as it causes her to be sleepy.  Also has had some cough but denies any SOB/wheezing. No xopenex use.  No ER visits/oral prednisone.    Past Medical History: Past Medical History:  Diagnosis Date   Anemia, unspecified    Asthma    Obesity     Objective:  BP (!) 138/96   Pulse 90   Temp 97.9 F (36.6 C) (Temporal)   Wt 203 lb 14.4 oz (92.5 kg)   SpO2 100%   BMI 35.00 kg/m  Body mass index is 35 kg/m. Physical Exam: GEN: alert, well developed HEENT: clear conjunctiva, TM grey and translucent, nose with mild inferior turbinate hypertrophy, boggy nasal mucosa, clear rhinorrhea, no cobblestoning HEART: regular rate and rhythm, no murmur LUNGS: clear to auscultation bilaterally, no coughing, unlabored respiration SKIN: no rashes or lesions  Assessment:   1. Seasonal and perennial allergic rhinitis   2. Mild intermittent asthma without complication   3. Chronic cough   4. Gastroesophageal reflux disease, unspecified whether esophagitis present     Plan/Recommendations:  Allergic Rhinitis Allergic Conjunctivitis Chronic Cough with Post Nasal Drainage - Uncontrolled, discussed starting a daily consistent regimen as below.  - Positive skin test 08/2022: grasses, weeds,  molds - Avoidance measures discussed. - Use nasal saline rinses before nose sprays such as with Neilmed Sinus Rinse over the counter bottle.  Use distilled water.   - Use Flonase 2 sprays each nostril daily or 1 spray each nostril twice daily. Aim upward and outward. - Use Azelastine 1-2 sprays each nostril twice daily. Aim upward and outward. - Use Xyzal  daily in PM.  Use Allegra  daily in AM as needed and definitely on the days you get your allergy shot.   - Continue AIT and Bring Epipen with you at each shot visit.   - Okay to use Sudafed over the counter for a short time when your sinuses are flared up.   - Okay to try [employer]  three times a day as needed for cough.   Mild Intermittent Asthma - Maintenance inhaler: none - Rescue inhaler: Xopenex 2 puffs as needed for respiratory symptoms of shortness of breath, or wheezing Asthma control goals:  Full participation in all desired activities (may need albuterol before activity) Albuterol use two times or less a week on average (not counting use with activity) Cough interfering with sleep two times or less a month Oral steroids no more than once a year No hospitalizations  GERD -Continue Omeprazole  daily. -Avoid lying down for at least two hours after a meal or after drinking acidic beverages, like soda, or other caffeinated beverages. This can help to prevent stomach contents from flowing back into the esophagus. -Keep your head elevated while you sleep.  Using an extra pillow or two can also help to prevent reflux. -Eat smaller and more frequent meals each day instead of a few large meals. This promotes digestion and can aid in preventing heartburn. -Wear loose-fitting clothes to ease pressure on the stomach, which can worsen heartburn and reflux. -Reduce excess weight around the midsection. This can ease pressure on the stomach. Such pressure can force some stomach contents back up the esophagus.    Keep  follow up as scheduled in May.   Alesia Morin, MD [facility]

## 2023-02-06 NOTE — Patient Instructions (Addendum)
Allergic Rhinitis Allergic Conjunctivitis Chronic Cough Post Nasal Drainage - Positive skin test 08/2022: grasses, weeds, molds - Avoidance measures discussed. - Use nasal saline rinses before nose sprays such as with Neilmed Sinus Rinse over the counter bottle.  Use distilled water.   - Use Flonase 2 sprays each nostril daily or 1 spray each nostril twice daily. Aim upward and outward. - Use Azelastine 1-2 sprays each nostril twice daily. Aim upward and outward. - Use Xyzal  daily in PM.  Use Allegra  daily in AM as needed and definitely on the days you get your allergy shot.   - Continue AIT and Bring Epipen with you at each shot visit.   - Okay to use Sudafed over the counter for a short time when your sinuses are flared up.   - Okay to try Tessalon Perles  three times a day as needed for cough.   Mild Intermittent Asthma - Maintenance inhaler: none - Rescue inhaler: Xopenex 2 puffs as needed for respiratory symptoms of shortness of breath, or wheezing Asthma control goals:  Full participation in all desired activities (may need albuterol before activity) Albuterol use two times or less a week on average (not counting use with activity) Cough interfering with sleep two times or less a month Oral steroids no more than once a year No hospitalizations  GERD -Continue Omeprazole  daily. -Avoid lying down for at least two hours after a meal or after drinking acidic beverages, like soda, or other caffeinated beverages. This can help to prevent stomach contents from flowing back into the esophagus. -Keep your head elevated while you sleep. Using an extra pillow or two can also help to prevent reflux. -Eat smaller and more frequent meals each day instead of a few large meals. This promotes digestion and can aid in preventing heartburn. -Wear loose-fitting clothes to ease pressure on the stomach, which can worsen heartburn and reflux. -Reduce excess weight around the  midsection. This can ease pressure on the stomach. Such pressure can force some stomach contents back up the esophagus.

## 2023-02-11 ENCOUNTER — Ambulatory Visit (INDEPENDENT_AMBULATORY_CARE_PROVIDER_SITE_OTHER): Payer: Commercial Managed Care - PPO

## 2023-02-11 DIAGNOSIS — J309 Allergic rhinitis, unspecified: Secondary | ICD-10-CM

## 2023-02-12 NOTE — Telephone Encounter (Signed)
Spoke to patient on 02/04/23 about Dr. Eliane Decree recommendation. Patient was called today to advise her that the CXR is not needed and to take the Allegra 180 that is non drowsy. Advised patient to monitor her symptoms and if she gets drowsy driving to work then don't take the medication in the morning before driving to work.

## 2023-02-18 ENCOUNTER — Ambulatory Visit (INDEPENDENT_AMBULATORY_CARE_PROVIDER_SITE_OTHER): Payer: Commercial Managed Care - PPO | Admitting: *Deleted

## 2023-02-18 DIAGNOSIS — J309 Allergic rhinitis, unspecified: Secondary | ICD-10-CM | POA: Diagnosis not present

## 2023-02-20 ENCOUNTER — Other Ambulatory Visit (HOSPITAL_COMMUNITY): Payer: Self-pay

## 2023-02-20 ENCOUNTER — Ambulatory Visit
Admission: EM | Admit: 2023-02-20 | Discharge: 2023-02-20 | Disposition: A | Discharge status: Home / Self Care | Payer: Commercial Managed Care - PPO | Attending: Physician Assistant | Admitting: Physician Assistant

## 2023-02-20 DIAGNOSIS — R2243 Localized swelling, mass and lump, lower limb, bilateral: Secondary | ICD-10-CM | POA: Diagnosis not present

## 2023-02-20 LAB — POCT URINALYSIS DIP (MANUAL ENTRY)
Bilirubin, UA: NEGATIVE
Blood, UA: NEGATIVE
Glucose, UA: NEGATIVE mg/dL
Ketones, POC UA: NEGATIVE mg/dL
Leukocytes, UA: NEGATIVE
Nitrite, UA: NEGATIVE
Protein Ur, POC: 30 mg/dL — AB
Spec Grav, UA: 1.02 (ref 1.010–1.025)
Urobilinogen, UA: 2 E.U./dL — AB
pH, UA: 8.5 — AB (ref 5.0–8.0)

## 2023-02-20 MED ORDER — FUROSEMIDE 20 MG PO TABS: 20.0000 mg | ORAL_TABLET | Freq: Every day | ORAL | 0 refills | Status: AC | PRN

## 2023-02-20 NOTE — ED Triage Notes (Signed)
Pt reports bilateral leg swelling when seating x 2  days. 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   Pt reports she use Lasix prn, but she do not have more.

## 2023-02-20 NOTE — ED Provider Notes (Signed)
[facility]    CSN: [id number] Arrival date & time: 02/20/23  1547      History   Chief Complaint Chief Complaint  Patient presents with   Leg Swelling    HPI Jane Wu is a 52 y.o. female.   Patient presents today with a several day history of increasing swelling in her lateral legs.  She reports that this has been an intermittent problem in the past but has become more persistent and bothersome recently.  She does report a sedentary job where she sits for approximately 8 hours a day.  She is unsure if this could be contributing to her symptoms.  She has not been using any compression stockings but does have as needed Lasix which provides temporary relief of symptoms.  She has run out of this medication is requesting a refill if appropriate today.  She denies any history of thyroid condition, heart failure, chronic liver/kidney disease.  Does report she has previously had a kidney issue but was released by her nephrologist.  Denies any recent medication changes.  Denies any recent dietary changes or increased sodium consumption.  She denies associated nausea, vomiting, shortness of breath, chest discomfort.    Past Medical History:  Diagnosis Date   Anemia, unspecified    Asthma    Obesity     Patient Active Problem List   Diagnosis Date Noted   Anemia, unspecified    AMENORRHEA 05/27/2010   FATIGUE 05/27/2010   CELLULITIS 12/28/2009   DYSMENORRHEA 03/12/2009   MENORRHAGIA 03/12/2009   HIDRADENITIS SUPPURATIVA 03/12/2009   OBESITY 05/31/2008    Past Surgical History:  Procedure Laterality Date   CHOLECYSTECTOMY     right auxillary dissection for hydradenitis      OB History   No obstetric history on file.      Home Medications    Prior to Admission medications   Medication Sig Start Date End Date Taking? Authorizing Provider  ALPRAZolam (XANAX) 0.25 MG tablet Take 0.25 mg by mouth 3 (three) times daily as needed for anxiety. 08/10/19    [provider]  atorvastatin (LIPITOR) 20 MG tablet Take 20 mg by mouth every other day. 08/26/21   [provider]  azelastine (ASTELIN) 0.1 % nasal spray Use 1-2 sprays in each nostril twice daily. Aim upward and outward 02/06/23   Birder Robson, MD  cyclobenzaprine (FLEXERIL) 10 MG tablet Take 10 mg by mouth 3 (three) times daily as needed for muscle spasms. 40mg  2x a day 08/12/19   [provider]  doxycycline (VIBRAMYCIN) 100 MG capsule Take 1 capsule (100 mg total) by mouth 2 (two) times daily. 02/01/22   Jacalyn Lefevre, MD  EPINEPHrine 0.3 mg/0.3 mL IJ SOAJ injection Inject into the muscle.    [provider]  fexofenadine (ALLEGRA ALLERGY CHILDRENS) 30 MG/5ML suspension Take 5 mLs (30 mg total) by mouth daily. 02/06/23   Birder Robson, MD  fexofenadine [facility] ALLERGY) 180 MG tablet Take 1 tablet (180 mg total) by mouth daily. 12/23/22   Birder Robson, MD  fluticasone (FLONASE) 50 MCG/ACT nasal spray Use 2 sprays each nostril daily.  Shake gently before use & aim upward and outward. 02/06/23   Birder Robson, MD  furosemide (LASIX) 20 MG tablet Take 1 tablet (20 mg total) by mouth daily as needed. 02/20/23   Demani Mcbrien, Noberto Retort, PA-C  HYDROcodone-acetaminophen (NORCO/VICODIN) 5-325 MG tablet Take 1 tablet by mouth as needed. 11/18/22   [provider]  hydrOXYzine (  VISTARIL) 25 MG capsule as needed. 12/28/19   [provider]  ibuprofen (ADVIL) 800 MG tablet Take 1 tablet (800 mg total) by mouth 3 (three) times daily. 11/03/22   Leath-Warren, Sadie Haber, NP  ipratropium (ATROVENT) 0.03 % nasal spray SMARTSIG:1-2 Spray(s) Both Nares 1-3 Times Daily PRN    [provider]  levalbuterol (XOPENEX HFA) 45 MCG/ACT inhaler Inhale 1 puff into the lungs every 6 (six) hours as needed for wheezing or shortness of breath. 01/21/23   Birder Robson, MD  levocetirizine (XYZAL) 5 MG tablet Take 1 tablet (5 mg total) by mouth every evening. 02/06/23   Birder Robson, MD  omeprazole (PRILOSEC) 20 MG capsule Take 20 mg by mouth daily. 11/25/21   [provider]  omeprazole (PRILOSEC) 40 MG capsule Take 1 capsule (40 mg total) by mouth 2 (two) times daily 30 minutes before morning and evening meals 08/28/22     ondansetron (ZOFRAN) 4 MG tablet Take 1 tablet (4 mg total) by mouth every 8 (eight) hours as needed for nausea or vomiting. 11/03/22   Leath-Warren, Sadie Haber, NP  promethazine (PHENERGAN) 25 MG tablet Take 25 mg by mouth every 6 (six) hours as needed. 11/05/22   [provider]  triamcinolone (NASACORT) 55 MCG/ACT AERO nasal inhaler Place 2 sprays into the nose daily. 02/01/22   Jacalyn Lefevre, MD    Family History Family History  Problem Relation Age of Onset   Asthma Mother    Hypertension Mother        [name]    GER disease Mother    Asthma Brother     Social History Social History   Tobacco Use   Smoking status: Never   Smokeless tobacco: Never  Vaping Use   Vaping Use: Never used  Substance Use Topics   Alcohol use: Not Currently   Drug use: Never     Allergies   Bactrim [sulfamethoxazole-trimethoprim] and Morphine and related   Review of Systems Review of Systems  Constitutional:  Negative for activity change, appetite change, fatigue and fever.  Respiratory:  Negative for cough and shortness of breath.   Cardiovascular:  Positive for leg swelling. Negative for chest pain and palpitations.  Gastrointestinal:  Negative for abdominal pain, diarrhea, nausea and vomiting.  Musculoskeletal:  Positive for myalgias. Negative for arthralgias.     Physical Exam Triage Vital Signs ED Triage Vitals  Enc Vitals Group     BP 02/20/23 1553 (!) 142/94     Pulse Rate 02/20/23 1553 97     Resp 02/20/23 1553 18     Temp 02/20/23 1553 98.3 F (36.8 C)     Temp Source 02/20/23 1553 Oral     SpO2 02/20/23 1553 97 %     Weight --      Height --      Head Circumference --      Peak Flow --      Pain Score  02/20/23 1555 2     Pain Loc --      Pain Edu? --      Excl. in GC? --    No data found.  Updated Vital Signs BP (!) 142/94 (BP Location: Right Arm)   Pulse 97   Temp 98.3 F (36.8 C) (Oral)   Resp 18   SpO2 97%   Visual Acuity Right Eye Distance:   Left Eye Distance:   Bilateral Distance:    Right Eye Near:   Left Eye Near:  Bilateral Near:     Physical Exam Vitals reviewed.  Constitutional:      General: She is awake. She is not in acute distress.    Appearance: Normal appearance. She is well-developed. She is not ill-appearing.     Comments: Very pleasant female appears stated age no acute distress sitting comfortably in exam room  HENT:     Head: Normocephalic and atraumatic.  Cardiovascular:     Rate and Rhythm: Normal rate and regular rhythm.     Heart sounds: Normal heart sounds, S1 normal and S2 normal. No murmur heard. Pulmonary:     Effort: Pulmonary effort is normal.     Breath sounds: Normal breath sounds. No wheezing, rhonchi or rales.     Comments: Clear to auscultation bilaterally Abdominal:     Palpations: Abdomen is soft.     Tenderness: There is no abdominal tenderness.  Musculoskeletal:     Right lower leg: 1+ Edema present.     Left lower leg: 1+ Edema present.  Psychiatric:        Behavior: Behavior is cooperative.      UC Treatments / Results  Labs (all labs ordered are listed, but only abnormal results are displayed) Labs Reviewed  POCT URINALYSIS DIP (MANUAL ENTRY) - Abnormal; Notable for the following components:      Result Value   pH, UA 8.5 (*)    Protein Ur, POC =30 (*)    Urobilinogen, UA 2.0 (*)    All other components within normal limits  CBC WITH DIFFERENTIAL/PLATELET  COMPREHENSIVE METABOLIC PANEL  TSH  T4, FREE    EKG   Radiology No results found.  Procedures Procedures (including critical care time)  Medications Ordered in UC Medications - No data to display  Initial Impression / Assessment and Plan  / UC Course  I have reviewed the triage vital signs and the nursing notes.  Pertinent labs & imaging results that were available during my care of the patient were reviewed by me and considered in my medical decision making (see chart for details).     Patient is well-appearing, afebrile, nontoxic, nontachycardic.  Chest x-ray was deferred as she had oxygen saturation of 97% and normal lung sounds on exam.  We discussed that dependent edema is most likely cause of symptoms and recommended that she keep her legs elevated and use conservative treatment measures including avoiding salt and compression stockings to manage her symptoms.  A refill of Lasix was provided to have on hand as needed.  We discussed symptoms could be related to kidney dysfunction as she did have some protein in her urine.  Basic labs including CBC, CMP, thyroid was obtained and is pending.  Recommended she follow-up with her primary care if her symptoms do not resolve with conservative treatment measures.  Discussed that if she has any worsening symptoms including sudden weight gain, shortness of breath, severe swelling, nausea, vomiting she should be seen immediately.  Strict return precautions given.  All questions answered to patient satisfaction.  Final Clinical Impressions(s) / UC Diagnoses   Final diagnoses:  Localized swelling of both lower legs     Discharge Instructions      I believe that your symptoms are related to dependent edema.  Try to keep your legs elevated and walk around a few times per hour.  Try to avoid salt.  Use Lasix daily as needed to help manage her symptoms.  I also recommend getting over-the-counter compression stockings.  You did have some  protein in your urine and we will obtain additional blood work to investigate this and other potential causes of your swelling further.  I do recommend you follow-up with your primary care.  If anything worsens you have severe swelling, shortness of breath,  weakness, nausea/vomiting, unexpected weight change (more than 2 pounds in 24 hours or 5 pounds in a week) you should be seen immediately.     ED Prescriptions     Medication Sig Dispense Auth. Provider   furosemide (LASIX) 20 MG tablet Take 1 tablet (20 mg total) by mouth daily as needed. 14 tablet Yunuen Mordan, Noberto Retort, PA-C      PDMP not reviewed this encounter.   Jeani Hawking, PA-C 02/20/23 1635

## 2023-02-20 NOTE — Discharge Instructions (Signed)
I believe that your symptoms are related to dependent edema.  Try to keep your legs elevated and walk around a few times per hour.  Try to avoid salt.  Use Lasix daily as needed to help manage her symptoms.  I also recommend getting over-the-counter compression stockings.  You did have some protein in your urine and we will obtain additional blood work to investigate this and other potential causes of your swelling further.  I do recommend you follow-up with your primary care.  If anything worsens you have severe swelling, shortness of breath, weakness, nausea/vomiting, unexpected weight change (more than 2 pounds in 24 hours or 5 pounds in a week) you should be seen immediately.

## 2023-02-21 LAB — COMPREHENSIVE METABOLIC PANEL
ALT: 14 IU/L (ref 0–32)
AST: 18 IU/L (ref 0–40)
Albumin/Globulin Ratio: 1.5 (ref 1.2–2.2)
Albumin: 4.2 g/dL (ref 3.9–4.9)
Alkaline Phosphatase: 109 IU/L (ref 44–121)
BUN/Creatinine Ratio: 11 (ref 9–23)
BUN: 8 mg/dL (ref 6–24)
Bilirubin Total: 0.3 mg/dL (ref 0.0–1.2)
CO2: 23 mmol/L (ref 20–29)
Calcium: 9.1 mg/dL (ref 8.7–10.2)
Chloride: 103 mmol/L (ref 96–106)
Creatinine, Ser: 0.75 mg/dL (ref 0.57–1.00)
Globulin, Total: 2.8 g/dL (ref 1.5–4.5)
Glucose: 84 mg/dL (ref 70–99)
Potassium: 3.8 mmol/L (ref 3.5–5.2)
Sodium: 142 mmol/L (ref 134–144)
Total Protein: 7 g/dL (ref 6.0–8.5)
eGFR: 97 mL/min/{1.73_m2} (ref >59)

## 2023-02-21 LAB — CBC WITH DIFFERENTIAL/PLATELET
Basophils Absolute: 0 10*3/uL (ref 0.0–0.2)
Basos: 0 %
EOS (ABSOLUTE): 0.2 10*3/uL (ref 0.0–0.4)
Eos: 2 %
Hematocrit: 42.8 % (ref 34.0–46.6)
Hemoglobin: 14 g/dL (ref 11.1–15.9)
Immature Grans (Abs): 0 10*3/uL (ref 0.0–0.1)
Immature Granulocytes: 0 %
Lymphocytes Absolute: 2.7 10*3/uL (ref 0.7–3.1)
Lymphs: 39 %
MCH: 30 pg (ref 26.6–33.0)
MCHC: 32.7 g/dL (ref 31.5–35.7)
MCV: 92 fL (ref 79–97)
Monocytes Absolute: 0.8 10*3/uL (ref 0.1–0.9)
Monocytes: 11 %
Neutrophils Absolute: 3.3 10*3/uL (ref 1.4–7.0)
Neutrophils: 48 %
Platelets: 290 10*3/uL (ref 150–450)
RBC: 4.67 x10E6/uL (ref 3.77–5.28)
RDW: 14 % (ref 11.7–15.4)
WBC: 6.9 10*3/uL (ref 3.4–10.8)

## 2023-02-21 LAB — T4, FREE: Free T4: 1.18 ng/dL (ref 0.82–1.77)

## 2023-02-21 LAB — TSH: TSH: 2 u[IU]/mL (ref 0.450–4.500)

## 2023-02-24 ENCOUNTER — Ambulatory Visit: Payer: Commercial Managed Care - PPO | Admitting: Internal Medicine

## 2023-02-24 DIAGNOSIS — J309 Allergic rhinitis, unspecified: Secondary | ICD-10-CM

## 2023-02-25 ENCOUNTER — Ambulatory Visit (INDEPENDENT_AMBULATORY_CARE_PROVIDER_SITE_OTHER): Payer: Commercial Managed Care - PPO | Admitting: *Deleted

## 2023-02-25 DIAGNOSIS — J309 Allergic rhinitis, unspecified: Secondary | ICD-10-CM

## 2023-03-02 DIAGNOSIS — H6592 Unspecified nonsuppurative otitis media, left ear: Secondary | ICD-10-CM | POA: Diagnosis not present

## 2023-03-02 DIAGNOSIS — J029 Acute pharyngitis, unspecified: Secondary | ICD-10-CM | POA: Diagnosis not present

## 2023-03-02 DIAGNOSIS — J309 Allergic rhinitis, unspecified: Secondary | ICD-10-CM | POA: Diagnosis not present

## 2023-03-10 ENCOUNTER — Telehealth: Payer: Self-pay | Admitting: Internal Medicine

## 2023-03-10 ENCOUNTER — Other Ambulatory Visit (HOSPITAL_COMMUNITY): Payer: Self-pay

## 2023-03-10 ENCOUNTER — Other Ambulatory Visit: Payer: Self-pay | Admitting: Internal Medicine

## 2023-03-10 MED ORDER — MONTELUKAST SODIUM 10 MG PO TABS
10.0000 mg | ORAL_TABLET | Freq: Every day | ORAL | 5 refills | Status: DC
  Filled 2023-03-10: qty 90, 90d supply, fill #0

## 2023-03-10 NOTE — Telephone Encounter (Signed)
Patient called and also had [facility] sent a rx for singulair and she said that she is at the pharmacy now to pick it up and could we send it now.  [phone]

## 2023-03-10 NOTE — Telephone Encounter (Signed)
Patient has been made aware.

## 2023-03-10 NOTE — Telephone Encounter (Signed)
Spoke to the pharmacy. Pharmacist stated that they do have the refill order but it wasn't put in until 440pm today. Therefore it is not ready yet but they will inform the patient will it is ready for pick up.

## 2023-03-11 ENCOUNTER — Ambulatory Visit (INDEPENDENT_AMBULATORY_CARE_PROVIDER_SITE_OTHER): Payer: Commercial Managed Care - PPO

## 2023-03-11 DIAGNOSIS — J309 Allergic rhinitis, unspecified: Secondary | ICD-10-CM

## 2023-03-13 ENCOUNTER — Ambulatory Visit: Payer: Commercial Managed Care - PPO | Admitting: Internal Medicine

## 2023-03-13 ENCOUNTER — Ambulatory Visit (INDEPENDENT_AMBULATORY_CARE_PROVIDER_SITE_OTHER): Payer: Commercial Managed Care - PPO | Admitting: Internal Medicine

## 2023-03-13 ENCOUNTER — Other Ambulatory Visit: Payer: Self-pay

## 2023-03-13 VITALS — BP 120/86 | HR 95 | Temp 98.6°F | Resp 16 | Ht 64.0 in | Wt 203.4 lb

## 2023-03-13 DIAGNOSIS — J3089 Other allergic rhinitis: Secondary | ICD-10-CM

## 2023-03-13 DIAGNOSIS — K219 Gastro-esophageal reflux disease without esophagitis: Secondary | ICD-10-CM

## 2023-03-13 DIAGNOSIS — J302 Other seasonal allergic rhinitis: Secondary | ICD-10-CM | POA: Diagnosis not present

## 2023-03-13 DIAGNOSIS — J4520 Mild intermittent asthma, uncomplicated: Secondary | ICD-10-CM | POA: Diagnosis not present

## 2023-03-13 MED ORDER — LEVOCETIRIZINE DIHYDROCHLORIDE 5 MG PO TABS: 5.0000 mg | ORAL_TABLET | Freq: Every evening | ORAL | 5 refills | Status: DC

## 2023-03-13 MED ORDER — AZELASTINE HCL 0.1 % NA SOLN: NASAL | 5 refills | Status: DC

## 2023-03-13 MED ORDER — ALLEGRA ALLERGY CHILDRENS 30 MG/5ML PO SUSP: 30.0000 mg | Freq: Every day | ORAL | 5 refills | Status: DC

## 2023-03-13 MED ORDER — LEVALBUTEROL TARTRATE 45 MCG/ACT IN AERO: 1.0000 | INHALATION_SPRAY | Freq: Four times a day (QID) | RESPIRATORY_TRACT | 0 refills | Status: DC | PRN

## 2023-03-13 MED ORDER — MONTELUKAST SODIUM 10 MG PO TABS: 10.0000 mg | ORAL_TABLET | Freq: Every day | ORAL | 5 refills | Status: DC

## 2023-03-13 MED ORDER — FLUTICASONE PROPIONATE 50 MCG/ACT NA SUSP: NASAL | 5 refills | Status: DC

## 2023-03-13 NOTE — Addendum Note (Signed)
Addended by: Philipp Deputy on: 03/13/2023 04:45 PM   Modules accepted: Orders

## 2023-03-13 NOTE — Patient Instructions (Addendum)
Allergic Rhinitis and Allergic Conjunctivitis Chronic Cough with Post Nasal Drainage - Positive skin test 08/2022: grasses, weeds, molds - Avoidance measures discussed. - Use nasal saline rinses before nose sprays such as with Neilmed Sinus Rinse over the counter bottle.  Use distilled water.   - Use Flonase 2 sprays each nostril daily or 1 spray each nostril twice daily. Aim upward and outward. - Use Azelastine 1-2 sprays each nostril twice daily. Aim upward and outward. - Use Xyzal 5mg  daily in PM.  Use Allegra 30mg  daily in AM as needed and definitely on the days you get your allergy shot.   - Use Singulair 10mg  daily.  Stop this if you have any behavioral or mood changes.   - Continue AIT and Bring Epipen with you at each shot visit.    Mild Intermittent Asthma - Maintenance inhaler: none - Rescue inhaler: Xopenex 2 puffs as needed for respiratory symptoms of shortness of breath, or wheezing Asthma control goals:  Full participation in all desired activities (may need albuterol before activity) Albuterol use two times or less a week on average (not counting use with activity) Cough interfering with sleep two times or less a month Oral steroids no more than once a year No hospitalizations  GERD -Continue Omeprazole 40mg  daily. -Avoid lying down for at least two hours after a meal or after drinking acidic beverages, like soda, or other caffeinated beverages. This can help to prevent stomach contents from flowing back into the esophagus. -Keep your head elevated while you sleep. Using an extra pillow or two can also help to prevent reflux. -Eat smaller and more frequent meals each day instead of a few large meals. This promotes digestion and can aid in preventing heartburn. -Wear loose-fitting clothes to ease pressure on the stomach, which can worsen heartburn and reflux. -Reduce excess weight around the midsection. This can ease pressure on the stomach. Such pressure can force some  stomach contents back up the esophagus.

## 2023-03-13 NOTE — Progress Notes (Addendum)
FOLLOW UP Date of Service/Encounter:  03/13/23   Subjective:  Jane Wu (DOB: 1971-02-01) is a 52 y.o. female who [facility] for follow up for allergic rhinoconjunctivitis, mild intermittent asthma and GERD.   History obtained from: chart review and patient. Last visit was with me on 02/06/2023 and was still having PND with Cough so we discussed OTC Sudafed and tessalon perles.  Since last visit, she reports doing so-so.  She did undergo another course of prednisone/doxycycline for sinus issues, no fevers/purulent drainage.  Still continues to have trouble with post nasal drainage, congestion, runny nose.  Taking Flonase, Azelastine, Xyzal PM, Allegra AM.  Also started Singulair daily, has not had any mood/behavioral changes with it. AIT is going well, no reactions. Has an Epipen.   Asthma is doing well. Has only used Xopenex once last year when she was flared up with illness. No oral prednisone or ER visits since last visit.   GERD is okay. On Omperazole.    Past Medical History: Past Medical History:  Diagnosis Date   Anemia, unspecified    Asthma    Obesity     Objective:  BP 120/86   Pulse 95   Temp 98.6 F (37 C)   Resp 16   Ht 5\' 4"  (1.626 m)   Wt 203 lb 6.4 oz (92.3 kg)   SpO2 98%   BMI 34.91 kg/m  Body mass index is 34.91 kg/m. Physical Exam: GEN: alert, well developed HEENT: clear conjunctiva, TM grey and translucent, nose with moderate inferior turbinate hypertrophy, pink nasal mucosa, clear rhinorrhea, + cobblestoning HEART: regular rate and rhythm, no murmur LUNGS: clear to auscultation bilaterally, no coughing, unlabored respiration SKIN: no rashes or lesions   Spirometry:  Tracings reviewed. Her effort: Good reproducible efforts. FVC: 2.27L FEV1: 1.87L, 79% predicted FEV1/FVC ratio: 82% Interpretation: Spirometry consistent with normal pattern.  Please see scanned spirometry results for  details.  Assessment:   1. Mild intermittent asthma without complication   2. Seasonal and perennial allergic rhinitis   3. Gastroesophageal reflux disease, unspecified whether esophagitis present     Plan/Recommendations:  Allergic Rhinitis and Allergic Conjunctivitis Chronic Cough with Post Nasal Drainage - Positive skin test 08/2022: grasses, weeds, molds - Avoidance measures discussed. - Use nasal saline rinses before nose sprays such as with Neilmed Sinus Rinse over the counter bottle.  Use distilled water.   - Use Flonase 2 sprays each nostril daily or 1 spray each nostril twice daily. Aim upward and outward. - Use Azelastine 1-2 sprays each nostril twice daily. Aim upward and outward. - Use Xyzal 5mg  daily in PM.  Use Allegra 30mg  daily in AM as needed and definitely on the days you get your allergy shot.   - Use Singulair 10mg  daily.  Stop this if you have any behavioral or mood changes.   - Continue AIT and Bring Epipen with you at each shot visit.   - Can consider general immune screen if sinus infections continue.  I am not sure if these are truly infections or allergic inflammation; our goal is to get it under better control with long term AIT to prevent flare ups.   Mild Intermittent Asthma - Controlled, normal spirometry today. - Maintenance inhaler: none - Rescue inhaler: Xopenex 2 puffs as needed for respiratory symptoms of shortness of breath, or wheezing Asthma control goals:  Full participation in all desired activities (may need albuterol before activity) Albuterol use two times or  less a week on average (not counting use with activity) Cough interfering with sleep two times or less a month Oral steroids no more than once a year No hospitalizations  GERD -Continue Omeprazole 40mg  daily. -Avoid lying down for at least two hours after a meal or after drinking acidic beverages, like soda, or other caffeinated beverages. This can help to prevent stomach contents  from flowing back into the esophagus. -Keep your head elevated while you sleep. Using an extra pillow or two can also help to prevent reflux. -Eat smaller and more frequent meals each day instead of a few large meals. This promotes digestion and can aid in preventing heartburn. -Wear loose-fitting clothes to ease pressure on the stomach, which can worsen heartburn and reflux. -Reduce excess weight around the midsection. This can ease pressure on the stomach. Such pressure can force some stomach contents back up the esophagus.     Return in about 3 months (around 06/13/2023).  Alesia Morin, MD [facility]

## 2023-03-19 ENCOUNTER — Ambulatory Visit (INDEPENDENT_AMBULATORY_CARE_PROVIDER_SITE_OTHER): Payer: Commercial Managed Care - PPO

## 2023-03-19 DIAGNOSIS — J309 Allergic rhinitis, unspecified: Secondary | ICD-10-CM | POA: Diagnosis not present

## 2023-03-24 ENCOUNTER — Ambulatory Visit: Payer: Self-pay | Admitting: *Deleted

## 2023-03-25 ENCOUNTER — Ambulatory Visit (INDEPENDENT_AMBULATORY_CARE_PROVIDER_SITE_OTHER): Payer: Commercial Managed Care - PPO | Admitting: *Deleted

## 2023-03-25 DIAGNOSIS — J309 Allergic rhinitis, unspecified: Secondary | ICD-10-CM | POA: Diagnosis not present

## 2023-03-26 ENCOUNTER — Other Ambulatory Visit (HOSPITAL_COMMUNITY): Payer: Self-pay

## 2023-03-27 ENCOUNTER — Other Ambulatory Visit (HOSPITAL_COMMUNITY): Payer: Self-pay

## 2023-04-02 ENCOUNTER — Ambulatory Visit (INDEPENDENT_AMBULATORY_CARE_PROVIDER_SITE_OTHER): Payer: Commercial Managed Care - PPO

## 2023-04-02 DIAGNOSIS — J309 Allergic rhinitis, unspecified: Secondary | ICD-10-CM

## 2023-04-10 ENCOUNTER — Ambulatory Visit (INDEPENDENT_AMBULATORY_CARE_PROVIDER_SITE_OTHER): Payer: Commercial Managed Care - PPO | Admitting: *Deleted

## 2023-04-10 DIAGNOSIS — J309 Allergic rhinitis, unspecified: Secondary | ICD-10-CM

## 2023-04-17 ENCOUNTER — Ambulatory Visit (INDEPENDENT_AMBULATORY_CARE_PROVIDER_SITE_OTHER): Payer: Commercial Managed Care - PPO | Admitting: *Deleted

## 2023-04-17 DIAGNOSIS — J309 Allergic rhinitis, unspecified: Secondary | ICD-10-CM | POA: Diagnosis not present

## 2023-04-20 ENCOUNTER — Other Ambulatory Visit (HOSPITAL_COMMUNITY): Payer: Self-pay

## 2023-04-21 ENCOUNTER — Other Ambulatory Visit (HOSPITAL_COMMUNITY): Payer: Self-pay

## 2023-04-21 DIAGNOSIS — J029 Acute pharyngitis, unspecified: Secondary | ICD-10-CM | POA: Diagnosis not present

## 2023-04-21 DIAGNOSIS — H6123 Impacted cerumen, bilateral: Secondary | ICD-10-CM | POA: Diagnosis not present

## 2023-04-21 DIAGNOSIS — J309 Allergic rhinitis, unspecified: Secondary | ICD-10-CM | POA: Diagnosis not present

## 2023-04-21 DIAGNOSIS — Z713 Dietary counseling and surveillance: Secondary | ICD-10-CM | POA: Diagnosis not present

## 2023-04-21 DIAGNOSIS — R059 Cough, unspecified: Secondary | ICD-10-CM | POA: Diagnosis not present

## 2023-04-21 MED ORDER — ATORVASTATIN CALCIUM 20 MG PO TABS
20.0000 mg | ORAL_TABLET | Freq: Every day | ORAL | 1 refills | Status: DC
  Filled 2023-04-21: qty 30, 30d supply, fill #0
  Filled 2023-08-04: qty 30, 30d supply, fill #1

## 2023-04-22 ENCOUNTER — Other Ambulatory Visit: Payer: Self-pay | Admitting: Internal Medicine

## 2023-04-22 ENCOUNTER — Other Ambulatory Visit (HOSPITAL_COMMUNITY): Payer: Self-pay

## 2023-04-22 MED ORDER — LEVALBUTEROL TARTRATE 45 MCG/ACT IN AERO
1.0000 | INHALATION_SPRAY | Freq: Four times a day (QID) | RESPIRATORY_TRACT | 0 refills | Status: DC | PRN
  Filled 2023-04-22: qty 15, 25d supply, fill #0

## 2023-04-22 NOTE — Telephone Encounter (Signed)
Patient called and stated that she needs refills on her levalbuterol (xopenex) Patient would like this refill sent to [facility] in [place]. Patients call back number is [phone]

## 2023-05-07 ENCOUNTER — Ambulatory Visit (INDEPENDENT_AMBULATORY_CARE_PROVIDER_SITE_OTHER): Payer: Commercial Managed Care - PPO

## 2023-05-07 DIAGNOSIS — J309 Allergic rhinitis, unspecified: Secondary | ICD-10-CM | POA: Diagnosis not present

## 2023-05-09 ENCOUNTER — Ambulatory Visit
Admission: EM | Admit: 2023-05-09 | Discharge: 2023-05-09 | Disposition: A | Discharge status: Home / Self Care | Payer: Commercial Managed Care - PPO | Source: Home / Self Care

## 2023-05-09 DIAGNOSIS — R059 Cough, unspecified: Secondary | ICD-10-CM | POA: Insufficient documentation

## 2023-05-09 DIAGNOSIS — Z8709 Personal history of other diseases of the respiratory system: Secondary | ICD-10-CM | POA: Diagnosis not present

## 2023-05-09 LAB — POCT RAPID STREP A (OFFICE): Rapid Strep A Screen: NEGATIVE

## 2023-05-09 MED ORDER — PREDNISONE 20 MG PO TABS: 40.0000 mg | ORAL_TABLET | Freq: Every day | ORAL | 0 refills | Status: AC

## 2023-05-09 MED ORDER — PSEUDOEPH-BROMPHEN-DM 30-2-10 MG/5ML PO SYRP: 5.0000 mL | ORAL_SOLUTION | Freq: Four times a day (QID) | ORAL | 0 refills | Status: AC | PRN

## 2023-05-09 NOTE — Discharge Instructions (Signed)
The rapid strep test was negative.  A throat culture is pending.  You will be contacted if the pending test result is abnormal. Take medication as prescribed. Increase fluids and allow for plenty of rest. May take over-the-counter Tylenol or ibuprofen as needed for pain, fever, general discomfort. Warm salt water gargles 3-4 times daily while throat pain persist. Recommend using a humidifier in your bedroom at nighttime during sleep and sleeping elevated on pillows while cough symptoms persist. If symptoms do not improve with this treatment, please follow-up with your primary care physician or with your allergist for further evaluation. Follow-up as needed.

## 2023-05-09 NOTE — ED Provider Notes (Signed)
[facility]    CSN: [id number] Arrival date & time: 05/09/23  1222      History   Chief Complaint No chief complaint on file.   HPI Jane Wu is a 52 y.o. female.   The history is provided by the patient.   The patient presents for complaints of cough that been present for the past 3 days.  Patient reports history of chronic allergic rhinitis.  States that she had an allergy shot prior to her symptoms starting.  She denies fever, chills, headache, ear pain, ear drainage, wheezing, shortness of breath, chest pain, abdominal pain, nausea, vomiting, or diarrhea.  Patient also endorses sore throat.  She reports that she normally takes azelastine, Flonase, and Xyzal along with Jerilynn Som for her symptoms.  She states that she has not taken any medication today.  She also states that her allergist has instructed her to begin Singulair.  The patient states she took a home COVID test this morning which was negative.  Past Medical History:  Diagnosis Date   Anemia, unspecified    Asthma    Obesity     Patient Active Problem List   Diagnosis Date Noted   Anemia, unspecified    AMENORRHEA 05/27/2010   FATIGUE 05/27/2010   CELLULITIS 12/28/2009   DYSMENORRHEA 03/12/2009   MENORRHAGIA 03/12/2009   HIDRADENITIS SUPPURATIVA 03/12/2009   OBESITY 05/31/2008    Past Surgical History:  Procedure Laterality Date   CHOLECYSTECTOMY     right auxillary dissection for hydradenitis      OB History   No obstetric history on file.      Home Medications    Prior to Admission medications   Medication Sig Start Date End Date Taking? Authorizing Provider  ALPRAZolam (XANAX) 0.25 MG tablet Take 0.25 mg by mouth 3 (three) times daily as needed for anxiety. 08/10/19   [provider]  atorvastatin (LIPITOR) 20 MG tablet Take 20 mg by mouth every other day. 08/26/21   [provider]  atorvastatin (LIPITOR) 20 MG tablet Take 1 tablet (20 mg total) by  mouth daily for high cholesterol. 04/21/23     azelastine (ASTELIN) 0.1 % nasal spray Use 1-2 sprays in each nostril twice daily. Aim upward and outward 03/13/23   Birder Robson, MD  cyclobenzaprine (FLEXERIL) 10 MG tablet Take 10 mg by mouth 3 (three) times daily as needed for muscle spasms. 40mg  2x a day 08/12/19   [provider]  doxycycline (VIBRAMYCIN) 100 MG capsule Take 1 capsule (100 mg total) by mouth 2 (two) times daily. 02/01/22   Jacalyn Lefevre, MD  EPINEPHrine 0.3 mg/0.3 mL IJ SOAJ injection Inject into the muscle.    [provider]  fexofenadine (ALLEGRA ALLERGY CHILDRENS) 30 MG/5ML suspension Take 5 mLs (30 mg total) by mouth daily. 03/13/23   Birder Robson, MD  fexofenadine [facility] ALLERGY) 180 MG tablet Take 1 tablet (180 mg total) by mouth daily. 12/23/22   Birder Robson, MD  fluticasone (FLONASE) 50 MCG/ACT nasal spray Use 2 sprays each nostril daily.  Shake gently before use & aim upward and outward. 03/13/23   Patel, Ellen Henri, MD  furosemide (LASIX) 20 MG tablet Take 1 tablet (20 mg total) by mouth daily as needed. 02/20/23   Raspet, Noberto Retort, PA-C  HYDROcodone-acetaminophen (NORCO/VICODIN) 5-325 MG tablet Take 1 tablet by mouth as needed. 11/18/22   [provider]  hydrOXYzine (VISTARIL) 25 MG capsule as needed. 12/28/19   [provider]  ibuprofen (ADVIL) 800 MG tablet Take 1 tablet (800 mg total) by mouth 3 (three) times daily. 11/03/22   Brendolyn Stockley-Warren, Sadie Haber, NP  ipratropium (ATROVENT) 0.03 % nasal spray SMARTSIG:1-2 Spray(s) Both Nares 1-3 Times Daily PRN    [provider]  levalbuterol (XOPENEX HFA) 45 MCG/ACT inhaler Inhale 1 puff into the lungs every 6 hours as needed for wheezing or shortness of breath. 04/22/23   Birder Robson, MD  levocetirizine (XYZAL) 5 MG tablet Take 1 tablet (5 mg total) by mouth every evening. 03/13/23   Birder Robson, MD  montelukast (SINGULAIR) 10 MG tablet Take 1 tablet (10 mg total) by mouth at bedtime.  03/13/23   Birder Robson, MD  omeprazole (PRILOSEC) 20 MG capsule Take 20 mg by mouth daily. 11/25/21   [provider]  omeprazole (PRILOSEC) 40 MG capsule Take 1 capsule (40 mg total) by mouth 2 (two) times daily 30 minutes before morning and evening meals 08/28/22     ondansetron (ZOFRAN) 4 MG tablet Take 1 tablet (4 mg total) by mouth every 8 (eight) hours as needed for nausea or vomiting. 11/03/22   Poseidon Pam-Warren, Sadie Haber, NP  promethazine (PHENERGAN) 25 MG tablet Take 25 mg by mouth every 6 (six) hours as needed. 11/05/22   [provider]  triamcinolone (NASACORT) 55 MCG/ACT AERO nasal inhaler Place 2 sprays into the nose daily. 02/01/22   Jacalyn Lefevre, MD    Family History Family History  Problem Relation Age of Onset   Asthma Mother    Hypertension Mother        [place]    GER disease Mother    Asthma Brother     Social History Social History   Tobacco Use   Smoking status: Never   Smokeless tobacco: Never  Vaping Use   Vaping status: Never Used  Substance Use Topics   Alcohol use: Not Currently   Drug use: Never     Allergies   Bactrim [sulfamethoxazole-trimethoprim] and Morphine and codeine   Review of Systems Review of Systems Per HPI  Physical Exam Triage Vital Signs ED Triage Vitals  Encounter Vitals Group     BP 05/09/23 1233 127/84     Systolic BP Percentile --      Diastolic BP Percentile --      Pulse Rate 05/09/23 1233 (!) 102     Resp 05/09/23 1233 20     Temp 05/09/23 1233 98.1 F (36.7 C)     Temp Source 05/09/23 1233 Oral     SpO2 05/09/23 1233 97 %     Weight --      Height --      Head Circumference --      Peak Flow --      Pain Score 05/09/23 1230 0     Pain Loc --      Pain Education --      Exclude from Growth Chart --    No data found.  Updated Vital Signs BP 127/84 (BP Location: Right Arm)   Pulse (!) 102   Temp 98.1 F (36.7 C) (Oral)   Resp 20   LMP 04/08/2023 (Approximate)   SpO2 97%   Visual  Acuity Right Eye Distance:   Left Eye Distance:   Bilateral Distance:    Right Eye Near:   Left Eye Near:    Bilateral Near:     Physical Exam Vitals and nursing note reviewed.  Constitutional:      General: She  is not in acute distress.    Appearance: Normal appearance.  HENT:     Head: Normocephalic.     Right Ear: Tympanic membrane, ear canal and external ear normal.     Left Ear: Tympanic membrane, ear canal and external ear normal.     Nose: Congestion present. No rhinorrhea.     Right Turbinates: Enlarged and swollen.     Left Turbinates: Enlarged and swollen.     Right Sinus: No maxillary sinus tenderness or frontal sinus tenderness.     Left Sinus: No maxillary sinus tenderness or frontal sinus tenderness.     Mouth/Throat:     Lips: Pink.     Mouth: Mucous membranes are moist.     Pharynx: Uvula midline. Pharyngeal swelling, posterior oropharyngeal erythema and postnasal drip present. No oropharyngeal exudate or uvula swelling.     Tonsils: 1+ on the right. 1+ on the left.     Comments: Cobblestoning present to posterior oropharynx Eyes:     Extraocular Movements: Extraocular movements intact.     Conjunctiva/sclera: Conjunctivae normal.     Pupils: Pupils are equal, round, and reactive to light.  Cardiovascular:     Rate and Rhythm: Regular rhythm. Tachycardia present.     Pulses: Normal pulses.     Heart sounds: Normal heart sounds.  Pulmonary:     Effort: Pulmonary effort is normal. No respiratory distress.     Breath sounds: Normal breath sounds. No stridor. No wheezing, rhonchi or rales.  Abdominal:     General: Bowel sounds are normal.     Palpations: Abdomen is soft.     Tenderness: There is no abdominal tenderness.  Musculoskeletal:     Cervical back: Normal range of motion.  Lymphadenopathy:     Cervical: No cervical adenopathy.  Skin:    General: Skin is warm and dry.  Neurological:     General: No focal deficit present.     Mental Status: She is  alert and oriented to person, place, and time.  Psychiatric:        Mood and Affect: Mood normal.        Behavior: Behavior normal.      UC Treatments / Results  Labs (all labs ordered are listed, but only abnormal results are displayed) Labs Reviewed  POCT RAPID STREP A (OFFICE)    EKG   Radiology No results found.  Procedures Procedures (including critical care time)  Medications Ordered in UC Medications - No data to display  Initial Impression / Assessment and Plan / UC Course  I have reviewed the triage vital signs and the nursing notes.  Pertinent labs & imaging results that were available during my care of the patient were reviewed by me and considered in my medical decision making (see chart for details).  The patient is well-appearing, she is in no acute distress, vital signs are stable.  Rapid strep test was negative, throat culture is pending.  Suspect symptoms are most likely exacerbated by patient's chronic allergic rhinitis.  Will start patient on prednisone 40 mg for the next 5 days, and Bromfed-DM for her cough.  Supportive care recommendations were provided and discussed with the patient to include increasing fluids, allowing for plenty of rest, using a humidifier in her bedroom at nighttime during sleep, and sleeping elevated on pillows.  Patient advised to continue her current allergy regimen.  Patient advised to follow-up with her primary care physician or allergist if symptoms fail to improve.  Patient is in  agreement with this plan of care and verbalizes understanding.  All questions were answered.  Patient is stable for discharge.   Final Clinical Impressions(s) / UC Diagnoses   Final diagnoses:  None   Discharge Instructions   None    ED Prescriptions   None    PDMP not reviewed this encounter.   Abran Cantor, NP 05/09/23 1334

## 2023-05-09 NOTE — ED Triage Notes (Signed)
Pt reports she has had a cough x 3 days.  Has not had a cough before the allergy shots  Has taken doxy, tess pearls, and flonase

## 2023-05-11 LAB — CULTURE, GROUP A STREP (THRC)

## 2023-05-12 LAB — CULTURE, GROUP A STREP (THRC)

## 2023-05-13 ENCOUNTER — Ambulatory Visit (INDEPENDENT_AMBULATORY_CARE_PROVIDER_SITE_OTHER): Payer: Commercial Managed Care - PPO | Admitting: Internal Medicine

## 2023-05-13 ENCOUNTER — Other Ambulatory Visit (HOSPITAL_COMMUNITY): Payer: Self-pay

## 2023-05-13 ENCOUNTER — Encounter: Payer: Self-pay | Admitting: Internal Medicine

## 2023-05-13 ENCOUNTER — Telehealth: Payer: Self-pay | Admitting: Internal Medicine

## 2023-05-13 ENCOUNTER — Other Ambulatory Visit: Payer: Self-pay

## 2023-05-13 VITALS — BP 142/86 | HR 104 | Temp 98.0°F | Resp 20 | Ht 64.0 in | Wt 208.6 lb

## 2023-05-13 DIAGNOSIS — J302 Other seasonal allergic rhinitis: Secondary | ICD-10-CM | POA: Diagnosis not present

## 2023-05-13 DIAGNOSIS — J4520 Mild intermittent asthma, uncomplicated: Secondary | ICD-10-CM | POA: Diagnosis not present

## 2023-05-13 DIAGNOSIS — J3089 Other allergic rhinitis: Secondary | ICD-10-CM | POA: Diagnosis not present

## 2023-05-13 DIAGNOSIS — J329 Chronic sinusitis, unspecified: Secondary | ICD-10-CM

## 2023-05-13 MED ORDER — FLUTICASONE PROPIONATE HFA 110 MCG/ACT IN AERO
INHALATION_SPRAY | RESPIRATORY_TRACT | 1 refills | Status: DC
  Filled 2023-05-13: qty 12, 30d supply, fill #0
  Filled 2023-05-13: qty 1, fill #0

## 2023-05-13 MED ORDER — ALLEGRA ALLERGY CHILDRENS 30 MG/5ML PO SUSP
30.0000 mg | Freq: Every day | ORAL | 5 refills | Status: DC
  Filled 2023-05-13: qty 240, 48d supply, fill #0

## 2023-05-13 MED ORDER — FLUTICASONE PROPIONATE HFA 110 MCG/ACT IN AERO: INHALATION_SPRAY | RESPIRATORY_TRACT | 1 refills | Status: DC

## 2023-05-13 NOTE — Progress Notes (Signed)
FOLLOW UP Date of Service/Encounter:  05/13/23   Subjective:  Jane Wu (DOB: 1971-06-14) is a 52 y.o. female [facility] for follow up for an acute visit.   History obtained from: chart review and patient.  Reports mid week around 7/17, her Mom cut the grass after which she developed lots of congestion, post nasal drainage, mucoid cough that was worse at nighttime.  She was seen at urgent care and started on oral prednisone.  Still has a cough but mostly dry now, sometimes a little cloudy mucous in morning. Also with some post nasal drainage.  Taking Singulair, Xyzal, Flonase, Azelastine.   She wonders if it is due to poor air quality at home.  Denies any sick contacts. Home COVID test was negative.   Past Medical History: Past Medical History:  Diagnosis Date   Anemia, unspecified    Asthma    Obesity     Objective:  BP (!) 142/86 (BP Location: Right Arm, Patient Position: Sitting, Cuff Size: Large)   Pulse (!) 104   Temp 98 F (36.7 C) (Temporal)   Resp 20   Ht 5\' 4"  (1.626 m)   Wt 208 lb 9.6 oz (94.6 kg)   LMP 04/08/2023 (Approximate)   SpO2 99%   BMI 35.81 kg/m  Body mass index is 35.81 kg/m. Physical Exam: GEN: alert, well developed HEENT: clear conjunctiva, nose with mild inferior turbinate hypertrophy, pink nasal mucosa, clear rhinorrhea, + cobblestoning HEART: regular rate and rhythm, no murmur LUNGS: clear to auscultation bilaterally, no coughing, unlabored respiration SKIN: no rashes or lesions  Spirometry:  Tracings reviewed. Her effort: Good reproducible efforts. FVC: 2.25L FEV1: 1.83L, 77% predicted FEV1/FVC ratio: 81% Interpretation: Spirometry consistent with normal pattern.  Please see scanned spirometry results for details.  Assessment:   1. Recurrent sinus infections   2. Mild intermittent asthma without complication   3. Seasonal and perennial allergic rhinitis      Plan/Recommendations:  Mild Intermittent Asthma - Discussed possibility of post infectious cough, normal spirometry today.  Will do a short course of ICS to see if it helps. Can consider CXR if cough persists but her lungs were clear on exam today.  - With respiratory illness or flare up such as now, use Flovent 2 puffs twice daily for 1-2 weeks.   - Maintenance inhaler: none - Rescue inhaler: Xopenex 2 puffs as needed for respiratory symptoms of shortness of breath, or wheezing Asthma control goals:  Full participation in all desired activities (may need albuterol before activity) Albuterol use two times or less a week on average (not counting use with activity) Cough interfering with sleep two times or less a month Oral steroids no more than once a year No hospitalizations   Allergic Rhinitis and Allergic Conjunctivitis Chronic Cough with Post Nasal Drainage Recurrent Sinus Infections - Positive skin test 08/2022: grasses, weeds, molds - Avoidance measures discussed. - Use nasal saline rinses before nose sprays such as with Neilmed Sinus Rinse over the counter bottle.  Use distilled water.   - Use Flonase 2 sprays each nostril daily or 1 spray each nostril twice daily. Aim upward and outward. - Use Azelastine 1-2 sprays each nostril twice daily. Aim upward and outward. - Use Xyzal 5mg  daily in PM.  Use Allegra 30mg  daily in AM as needed and definitely on the days you get your allergy shot.   - Use Singulair 10mg  daily.  Stop this if you have  any behavioral or mood changes.   - Continue AIT and Bring Epipen with you at each shot visit.   - Will evaluate immune system with IgG, IgM, IgA and protein/polysaccharide titers due to frequent sinus infections requiring antibiotics.    GERD -Continue Omeprazole 40mg  daily. -Avoid lying down for at least two hours after a meal or after drinking acidic beverages, like soda, or other caffeinated beverages. This can help to prevent  stomach contents from flowing back into the esophagus. -Keep your head elevated while you sleep. Using an extra pillow or two can also help to prevent reflux. -Eat smaller and more frequent meals each day instead of a few large meals. This promotes digestion and can aid in preventing heartburn. -Wear loose-fitting clothes to ease pressure on the stomach, which can worsen heartburn and reflux. -Reduce excess weight around the midsection. This can ease pressure on the stomach. Such pressure can force some stomach contents back up the esophagus.    Keep follow up in August.   Alesia Morin, MD [facility]

## 2023-05-13 NOTE — Telephone Encounter (Signed)
Sokha called in and would like Flovent inhaler and Liquid Allegra sent to [facility].  Patient states it wasn't supposed to go to [facility].  Thank you!

## 2023-05-13 NOTE — Telephone Encounter (Signed)
This has already been taken care of. Went to addend and change medication location but the prescription was sent to [facility] outpatient pharmacy already.

## 2023-05-13 NOTE — Addendum Note (Signed)
Addended byAlesia Morin on: 05/13/2023 04:50 PM   Modules accepted: Orders

## 2023-05-13 NOTE — Patient Instructions (Addendum)
Allergic Rhinitis and Allergic Conjunctivitis Chronic Cough with Post Nasal Drainage - Positive skin test 08/2022: grasses, weeds, molds - Avoidance measures discussed. - Use nasal saline rinses before nose sprays such as with Neilmed Sinus Rinse over the counter bottle.  Use distilled water.   - Use Flonase 2 sprays each nostril daily or 1 spray each nostril twice daily. Aim upward and outward. - Use Azelastine 1-2 sprays each nostril twice daily. Aim upward and outward. - Use Xyzal 5mg  daily in PM.  Use Allegra 30mg  daily in AM as needed and definitely on the days you get your allergy shot.   - Use Singulair 10mg  daily.  Stop this if you have any behavioral or mood changes.   - Continue AIT and Bring Epipen with you at each shot visit.    Mild Intermittent Asthma - With respiratory illness or flare up such as now, use Flovent 2 puffs twice daily for 1-2 weeks.   - Maintenance inhaler: none - Rescue inhaler: Xopenex 2 puffs as needed for respiratory symptoms of shortness of breath, or wheezing Asthma control goals:  Full participation in all desired activities (may need albuterol before activity) Albuterol use two times or less a week on average (not counting use with activity) Cough interfering with sleep two times or less a month Oral steroids no more than once a year No hospitalizations  GERD -Continue Omeprazole 40mg  daily. -Avoid lying down for at least two hours after a meal or after drinking acidic beverages, like soda, or other caffeinated beverages. This can help to prevent stomach contents from flowing back into the esophagus. -Keep your head elevated while you sleep. Using an extra pillow or two can also help to prevent reflux. -Eat smaller and more frequent meals each day instead of a few large meals. This promotes digestion and can aid in preventing heartburn. -Wear loose-fitting clothes to ease pressure on the stomach, which can worsen heartburn and reflux. -Reduce  excess weight around the midsection. This can ease pressure on the stomach. Such pressure can force some stomach contents back up the esophagus.

## 2023-05-14 ENCOUNTER — Other Ambulatory Visit (HOSPITAL_COMMUNITY): Payer: Self-pay

## 2023-05-14 ENCOUNTER — Telehealth: Payer: Self-pay | Admitting: Internal Medicine

## 2023-05-14 LAB — STREP PNEUMONIAE 23 SEROTYPES IGG

## 2023-05-14 LAB — IGG, IGA, IGM
IgA/Immunoglobulin A, Serum: 239 mg/dL (ref 87–352)
IgG (Immunoglobin G), Serum: 1344 mg/dL (ref 586–1602)
IgM (Immunoglobulin M), Srm: 127 mg/dL (ref 26–217)

## 2023-05-14 LAB — DIPHTHERIA / TETANUS ANTIBODY PANEL

## 2023-05-14 NOTE — Telephone Encounter (Signed)
Patient states she was seen yesterday and her hacking cough was addressed. Patient states she has a sore throat and would like to know if there are any recommendations of what she can take for the sore throat or if something could be prescribed.   Best contact number: [phone]  [facility] - [address], [place], [place] [address]

## 2023-05-14 NOTE — Telephone Encounter (Signed)
Spoke with patient, informed her of Dr. Elmyra Ricks note. Patient verbalized understanding. She asked if she should come in tomorrow to get her allergy injections. I did advise her to wait until next week when she is feeling a little better. Patient verbalized understanding and was grateful for the call back.

## 2023-05-14 NOTE — Telephone Encounter (Signed)
Please call patient back.  For the sore throat she can take some over the counter chloraseptic spray or Cepacol sore throat lozenge.   She can also take some honey to soothe the throat.

## 2023-05-21 NOTE — Addendum Note (Signed)
Addended by: Areta Haber B on: 05/21/2023 01:39 PM   Modules accepted: Orders

## 2023-05-22 ENCOUNTER — Other Ambulatory Visit: Payer: Self-pay | Admitting: Internal Medicine

## 2023-05-22 DIAGNOSIS — J329 Chronic sinusitis, unspecified: Secondary | ICD-10-CM

## 2023-05-22 NOTE — Progress Notes (Signed)
Repeat S pneumo titers labwork ordered.

## 2023-06-02 ENCOUNTER — Ambulatory Visit: Payer: Commercial Managed Care - PPO

## 2023-06-04 ENCOUNTER — Ambulatory Visit: Payer: Commercial Managed Care - PPO

## 2023-06-05 ENCOUNTER — Ambulatory Visit: Payer: Commercial Managed Care - PPO

## 2023-06-05 DIAGNOSIS — J329 Chronic sinusitis, unspecified: Secondary | ICD-10-CM | POA: Diagnosis not present

## 2023-06-05 DIAGNOSIS — Z23 Encounter for immunization: Secondary | ICD-10-CM | POA: Diagnosis not present

## 2023-06-05 NOTE — Progress Notes (Signed)
Pneumococcal vaccination consent signed.   Site of injection: R  Lot: [id number] Exp Date: 11/12/2024

## 2023-06-19 ENCOUNTER — Other Ambulatory Visit (HOSPITAL_COMMUNITY): Payer: Self-pay

## 2023-06-19 ENCOUNTER — Encounter: Payer: Self-pay | Admitting: Internal Medicine

## 2023-06-19 ENCOUNTER — Other Ambulatory Visit: Payer: Self-pay

## 2023-06-19 ENCOUNTER — Ambulatory Visit (INDEPENDENT_AMBULATORY_CARE_PROVIDER_SITE_OTHER): Payer: Commercial Managed Care - PPO | Admitting: Internal Medicine

## 2023-06-19 VITALS — BP 118/74 | HR 92 | Temp 98.2°F | Wt 201.8 lb

## 2023-06-19 DIAGNOSIS — J302 Other seasonal allergic rhinitis: Secondary | ICD-10-CM | POA: Diagnosis not present

## 2023-06-19 DIAGNOSIS — J3089 Other allergic rhinitis: Secondary | ICD-10-CM | POA: Diagnosis not present

## 2023-06-19 DIAGNOSIS — K219 Gastro-esophageal reflux disease without esophagitis: Secondary | ICD-10-CM

## 2023-06-19 DIAGNOSIS — J329 Chronic sinusitis, unspecified: Secondary | ICD-10-CM

## 2023-06-19 DIAGNOSIS — J4520 Mild intermittent asthma, uncomplicated: Secondary | ICD-10-CM | POA: Diagnosis not present

## 2023-06-19 MED ORDER — MONTELUKAST SODIUM 10 MG PO TABS
10.0000 mg | ORAL_TABLET | Freq: Every day | ORAL | 5 refills | Status: DC
  Filled 2023-06-19: qty 30, 30d supply, fill #0
  Filled 2023-08-04: qty 30, 30d supply, fill #1

## 2023-06-19 MED ORDER — FLUTICASONE PROPIONATE 50 MCG/ACT NA SUSP
NASAL | 5 refills | Status: DC
  Filled 2023-06-19: qty 16, 30d supply, fill #0
  Filled 2023-08-04: qty 16, 30d supply, fill #1

## 2023-06-19 MED ORDER — LEVOCETIRIZINE DIHYDROCHLORIDE 5 MG PO TABS
5.0000 mg | ORAL_TABLET | Freq: Every evening | ORAL | 5 refills | Status: DC
  Filled 2023-06-19: qty 30, 30d supply, fill #0
  Filled 2023-08-04: qty 30, 30d supply, fill #1

## 2023-06-19 MED ORDER — AZELASTINE HCL 0.1 % NA SOLN
NASAL | 5 refills | Status: DC
  Filled 2023-06-19: qty 30, 30d supply, fill #0
  Filled 2023-08-04: qty 30, 30d supply, fill #1

## 2023-06-19 MED ORDER — LEVALBUTEROL TARTRATE 45 MCG/ACT IN AERO
1.0000 | INHALATION_SPRAY | Freq: Four times a day (QID) | RESPIRATORY_TRACT | 0 refills | Status: DC | PRN
  Filled 2023-06-19: qty 15, 25d supply, fill #0

## 2023-06-19 NOTE — Patient Instructions (Addendum)
Allergic Rhinitis and Allergic Conjunctivitis Chronic Cough with Post Nasal Drainage - Positive skin test 08/2022: grasses, weeds, molds - Avoidance measures discussed. - Use nasal saline rinses before nose sprays such as with Neilmed Sinus Rinse over the counter bottle.  Use distilled water.   - Use Flonase 1 spray each nostril twice daily. Aim upward and outward. - Use Azelastine 1-2 sprays each nostril twice daily. Aim upward and outward. - Use Xyzal 5mg  daily in PM.  Use Allegra 30mg  daily in AM as needed and definitely on the days you get your allergy shot.   - Use Singulair 10mg  daily.  Stop this if you have any behavioral or mood changes.   - Get back on schedule for allergy shots and Bring Epipen with you at each shot visit.   Frequent Sinus Infections - Received Pneumovax shot 8/16.  About 6-8 weeks from this date, please get the Pneumonia lab work done.    Mild Intermittent Asthma - With respiratory illness or flare up such as now, use Flovent 2 puffs twice daily for 1-2 weeks.   - Maintenance inhaler: none - Rescue inhaler: Xopenex 2 puffs as needed for respiratory symptoms of shortness of breath, or wheezing Asthma control goals:  Full participation in all desired activities (may need albuterol before activity) Albuterol use two times or less a week on average (not counting use with activity) Cough interfering with sleep two times or less a month Oral steroids no more than once a year No hospitalizations  GERD -Continue Omeprazole 40mg  daily. -Avoid lying down for at least two hours after a meal or after drinking acidic beverages, like soda, or other caffeinated beverages. This can help to prevent stomach contents from flowing back into the esophagus. -Keep your head elevated while you sleep. Using an extra pillow or two can also help to prevent reflux. -Eat smaller and more frequent meals each day instead of a few large meals. This promotes digestion and can aid in  preventing heartburn. -Wear loose-fitting clothes to ease pressure on the stomach, which can worsen heartburn and reflux. -Reduce excess weight around the midsection. This can ease pressure on the stomach. Such pressure can force some stomach contents back up the esophagus.

## 2023-06-19 NOTE — Progress Notes (Signed)
FOLLOW UP Date of Service/Encounter:  06/19/23   Subjective:  Jane Wu (DOB: 1971/09/11) is a 52 y.o. female who [facility] for follow up for asthma, allergic rhinitis, frequent sinus infections, reflux.   History obtained from: chart review and patient. Last visit was with me on 05/13/2023 and at the time, she was seen for post urgent care follow up after an upper respiratory infection.  Also with asthma, on PRN Albuterol/Flovent. Has allergic rhinoconjunctivitis with frequent sinus infections, on AIT and immune eval showed low pneumococcal titers. Pneumovax given 06/05/2023.   Still reports having a lot of runny nose, frequent throat clearing, post nasal drip.  Doing slightly better.  Has been off allergy shots but plans to get back on schedule.  Taking Flonase/Azelastine BID, Xyzal daily and Singulair daily.  Also was on Flovent PRN but not sure if it truly helped.  Rarely uses albuterol. No ER visits/oral prednisone since last visit. Reflux does okay as long as she takes the PPI daily.  Past Medical History: Past Medical History:  Diagnosis Date   Anemia, unspecified    Asthma    Obesity     Objective:  BP 118/74   Pulse 92   Temp 98.2 F (36.8 C) (Temporal)   Wt 201 lb 12.8 oz (91.5 kg)   SpO2 99%   BMI 34.64 kg/m  Body mass index is 34.64 kg/m. Physical Exam: GEN: alert, well developed HEENT: clear conjunctiva, TM grey and translucent, nose with mild nferior turbinate hypertrophy, pink nasal mucosa, clear rhinorrhea, + cobblestoning HEART: regular rate and rhythm, no murmur LUNGS: clear to auscultation bilaterally, no coughing, unlabored respiration SKIN: no rashes or lesions  Spirometry:  Tracings reviewed. Her effort: Good reproducible efforts. FVC: 2.5L FEV1: 2.12L, 89% predicted FEV1/FVC ratio: 85% Interpretation: Spirometry consistent with normal pattern.  Please see scanned spirometry results for  details.    Assessment:   1. Mild intermittent asthma without complication   2. Recurrent sinus infections   3. Seasonal and perennial allergic rhinitis   4. Gastroesophageal reflux disease, unspecified whether esophagitis present     Plan/Recommendations:  Allergic Rhinitis and Allergic Conjunctivitis Chronic Cough with Post Nasal Drainage - Uncontrolled, discussed restarting AIT.  - Positive skin test 08/2022: grasses, weeds, molds - Avoidance measures discussed. - Use nasal saline rinses before nose sprays such as with Neilmed Sinus Rinse over the counter bottle.  Use distilled water.   - Use Flonase 1 spray each nostril twice daily. Aim upward and outward. - Use Azelastine 1-2 sprays each nostril twice daily. Aim upward and outward. - Use Xyzal 5mg  daily in PM.  Use Allegra 30mg  daily in AM as needed and definitely on the days you get your allergy shot.   - Use Singulair 10mg  daily.  Stop this if you have any behavioral or mood changes.   - Get back on schedule for allergy shots and Bring Epipen with you at each shot visit.   Frequent Sinus Infections - Low S pneumo titers. Ig were normal.  - Received Pneumovax shot 8/16.  About 6-8 weeks from this date, please get the Pneumonia lab work done.    Mild Intermittent Asthma - Well controlled, normal spirometry today.  - With respiratory illness or flare up such as now, use Flovent 2 puffs twice daily for 1-2 weeks.   - Maintenance inhaler: none - Rescue inhaler: Xopenex 2 puffs as needed for respiratory symptoms of shortness of breath, or  wheezing Asthma control goals:  Full participation in all desired activities (may need albuterol before activity) Albuterol use two times or less a week on average (not counting use with activity) Cough interfering with sleep two times or less a month Oral steroids no more than once a year No hospitalizations  GERD -Continue Omeprazole 40mg  daily. -Avoid lying down for at least two  hours after a meal or after drinking acidic beverages, like soda, or other caffeinated beverages. This can help to prevent stomach contents from flowing back into the esophagus. -Keep your head elevated while you sleep. Using an extra pillow or two can also help to prevent reflux. -Eat smaller and more frequent meals each day instead of a few large meals. This promotes digestion and can aid in preventing heartburn. -Wear loose-fitting clothes to ease pressure on the stomach, which can worsen heartburn and reflux. -Reduce excess weight around the midsection. This can ease pressure on the stomach. Such pressure can force some stomach contents back up the esophagus.     Return in about 3 months (around 09/19/2023).  Alesia Morin, MD [facility]

## 2023-06-20 ENCOUNTER — Other Ambulatory Visit (HOSPITAL_COMMUNITY): Payer: Self-pay

## 2023-06-24 ENCOUNTER — Other Ambulatory Visit (HOSPITAL_COMMUNITY): Payer: Self-pay

## 2023-06-25 ENCOUNTER — Ambulatory Visit (INDEPENDENT_AMBULATORY_CARE_PROVIDER_SITE_OTHER): Payer: Commercial Managed Care - PPO

## 2023-06-25 DIAGNOSIS — J309 Allergic rhinitis, unspecified: Secondary | ICD-10-CM | POA: Diagnosis not present

## 2023-06-26 ENCOUNTER — Telehealth: Payer: Self-pay | Admitting: Internal Medicine

## 2023-06-26 ENCOUNTER — Other Ambulatory Visit (HOSPITAL_COMMUNITY): Payer: Self-pay

## 2023-06-26 NOTE — Telephone Encounter (Signed)
Called and spoke with the patient, she stated that back in May it was discussed if she should consider doing a form to work from home to protect herself from COVID, Flu, and RSV during the months when they are more prevalent. She stated that she doesn't believe that her work has a Transport planner form but is wondering if we could write a letter stating that she is at higher risk for COVID, Flu, and RSV and should work from home during the months that these illnesses are high in numbers. She did also want to know if she needs to do repeat skin testing for her environmental allergies since she had them done in November of 2023. She did also ask when she should get her repeat labs following the Pneumovax. I advised that she should get repeat labs the week of September 16th or 23rd. Patient verbalized understanding.

## 2023-06-26 NOTE — Telephone Encounter (Signed)
Patient called to get a form for infection exposure and requested a call back.

## 2023-07-03 ENCOUNTER — Ambulatory Visit (INDEPENDENT_AMBULATORY_CARE_PROVIDER_SITE_OTHER): Payer: Self-pay | Admitting: *Deleted

## 2023-07-03 ENCOUNTER — Other Ambulatory Visit (HOSPITAL_COMMUNITY): Payer: Self-pay

## 2023-07-03 ENCOUNTER — Other Ambulatory Visit: Payer: Self-pay | Admitting: Internal Medicine

## 2023-07-03 DIAGNOSIS — J309 Allergic rhinitis, unspecified: Secondary | ICD-10-CM

## 2023-07-07 ENCOUNTER — Other Ambulatory Visit: Payer: Self-pay | Admitting: Internal Medicine

## 2023-07-07 ENCOUNTER — Other Ambulatory Visit (HOSPITAL_COMMUNITY): Payer: Self-pay

## 2023-07-09 ENCOUNTER — Ambulatory Visit (INDEPENDENT_AMBULATORY_CARE_PROVIDER_SITE_OTHER): Payer: Commercial Managed Care - PPO | Admitting: *Deleted

## 2023-07-09 DIAGNOSIS — J329 Chronic sinusitis, unspecified: Secondary | ICD-10-CM | POA: Diagnosis not present

## 2023-07-09 DIAGNOSIS — J309 Allergic rhinitis, unspecified: Secondary | ICD-10-CM

## 2023-07-09 NOTE — Telephone Encounter (Signed)
Jane Wu came into the office to check the status of her letter.  I informed her that Dr. Allena Katz is out of the office today and would get my message tomorrow.

## 2023-07-10 ENCOUNTER — Encounter: Payer: Self-pay | Admitting: Internal Medicine

## 2023-07-10 ENCOUNTER — Other Ambulatory Visit (HOSPITAL_COMMUNITY): Payer: Self-pay

## 2023-07-10 ENCOUNTER — Other Ambulatory Visit (HOSPITAL_BASED_OUTPATIENT_CLINIC_OR_DEPARTMENT_OTHER): Payer: Self-pay

## 2023-07-10 MED ORDER — BENZONATATE 100 MG PO CAPS
100.0000 mg | ORAL_CAPSULE | Freq: Three times a day (TID) | ORAL | 0 refills | Status: DC | PRN
  Filled 2023-07-10 – 2023-08-04 (×2): qty 15, 5d supply, fill #0

## 2023-07-16 LAB — STREP PNEUMONIAE 23 SEROTYPES IGG
Pneumo Ab Type 1*: 5 ug/mL (ref >1.3)
Pneumo Ab Type 12 (12F)*: 2.5 ug/mL (ref >1.3)
Pneumo Ab Type 14*: 30.1 ug/mL (ref >1.3)
Pneumo Ab Type 17 (17F)*: 4.1 ug/mL (ref >1.3)
Pneumo Ab Type 19 (19F)*: 1.2 ug/mL — ABNORMAL LOW (ref >1.3)
Pneumo Ab Type 2*: 6.4 ug/mL (ref >1.3)
Pneumo Ab Type 22 (22F)*: 2.3 ug/mL (ref >1.3)
Pneumo Ab Type 23 (23F)*: 1.3 ug/mL — ABNORMAL LOW (ref >1.3)
Pneumo Ab Type 26 (6B)*: 0.2 ug/mL — ABNORMAL LOW (ref >1.3)
Pneumo Ab Type 3*: 0.2 ug/mL — ABNORMAL LOW (ref >1.3)
Pneumo Ab Type 34 (10A)*: 0.8 ug/mL — ABNORMAL LOW (ref >1.3)
Pneumo Ab Type 4*: 3.2 ug/mL (ref >1.3)
Pneumo Ab Type 43 (11A)*: 3.6 ug/mL (ref >1.3)
Pneumo Ab Type 5*: 18.3 ug/mL (ref >1.3)
Pneumo Ab Type 51 (7F)*: 0.8 ug/mL — ABNORMAL LOW (ref >1.3)
Pneumo Ab Type 54 (15B)*: 5.5 ug/mL (ref >1.3)
Pneumo Ab Type 56 (18C)*: 0.4 ug/mL — ABNORMAL LOW (ref >1.3)
Pneumo Ab Type 57 (19A)*: 1.4 ug/mL (ref >1.3)
Pneumo Ab Type 68 (9V)*: 4.9 ug/mL (ref >1.3)
Pneumo Ab Type 70 (33F)*: 0.9 ug/mL — ABNORMAL LOW (ref >1.3)
Pneumo Ab Type 8*: 22.6 ug/mL (ref >1.3)
Pneumo Ab Type 9 (9N)*: 4 ug/mL (ref >1.3)

## 2023-07-20 ENCOUNTER — Other Ambulatory Visit (HOSPITAL_COMMUNITY): Payer: Self-pay

## 2023-07-27 IMAGING — CT CT CHEST W/ CM
2 of 4 series · 15 of 36 positions shown, 18 images · IV contrast (Omnipaque or Isovue)
Comparison: None.

CLINICAL DATA: Abnormal chest x-ray, possible mass

EXAM:
CT CHEST WITH CONTRAST
TECHNIQUE: Multidetector CT imaging of the chest was performed during
intravenous contrast administration.

[Series 2: routine chest with · axial · 0.87mm/px · z∈[+1808,+2072]mm · 12 of 158 slices shown, 15 images]
[im 13/158  mediastinal]
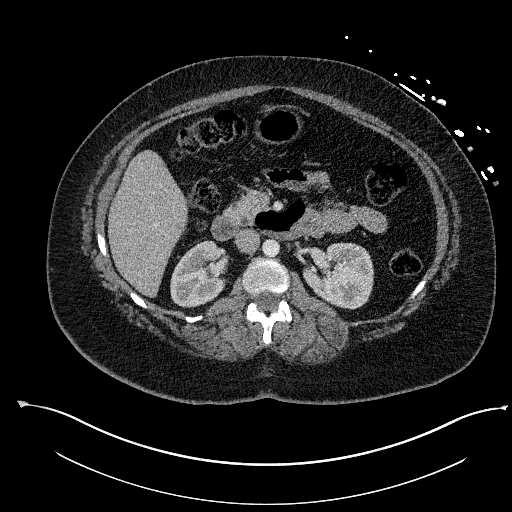
[im 13/158  lung]
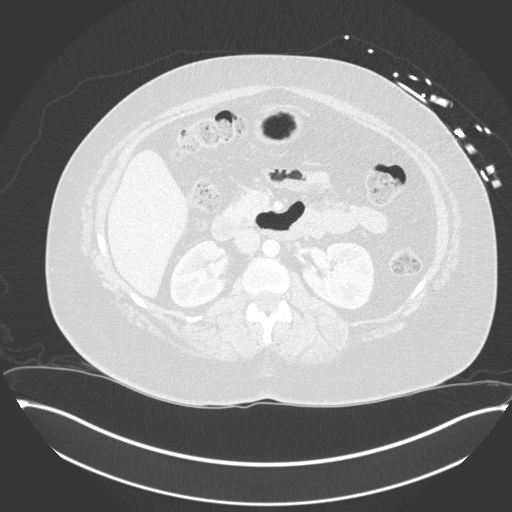
[im 25/158  lung]
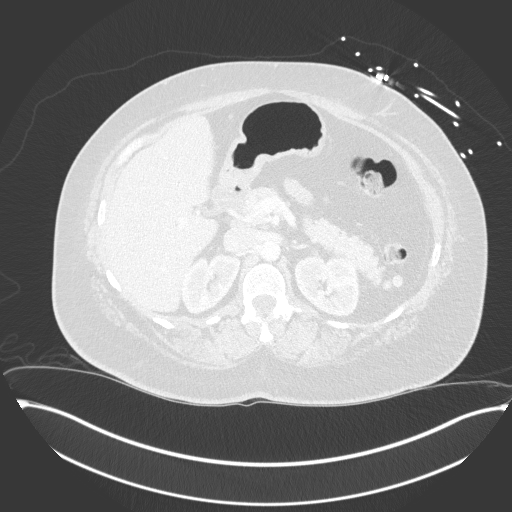
[im 37/158  lung]
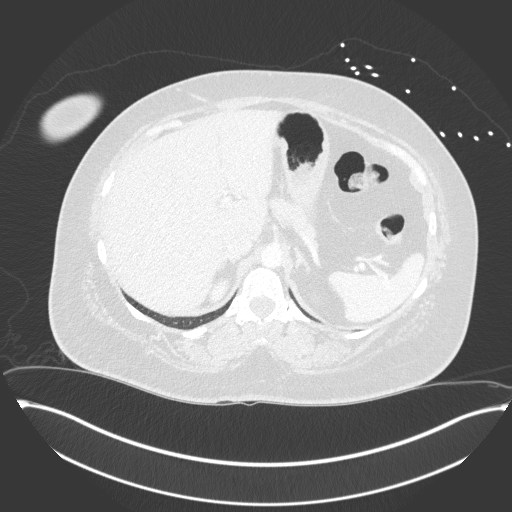
[im 49/158  lung]
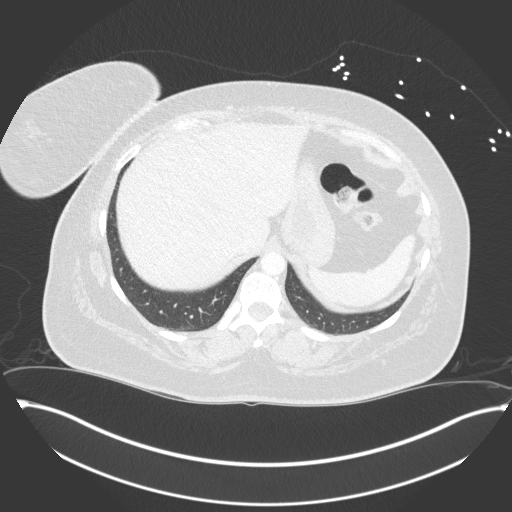
[im 61/158  mediastinal]
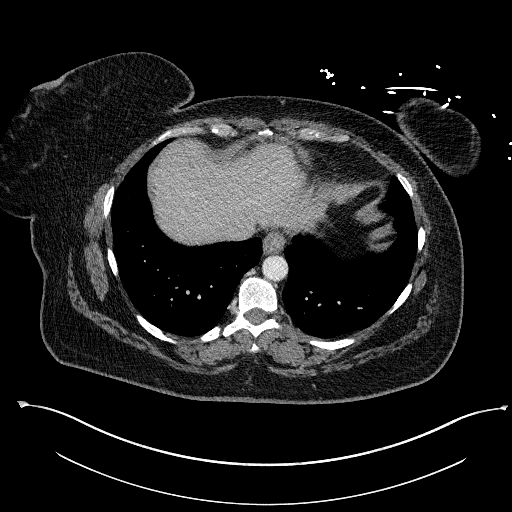
[im 61/158  lung]
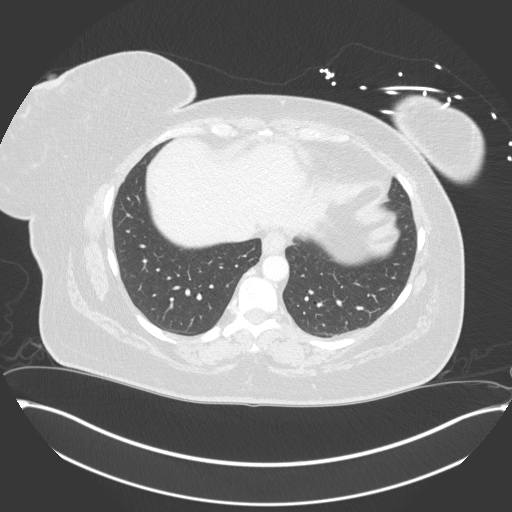
[im 73/158  lung]
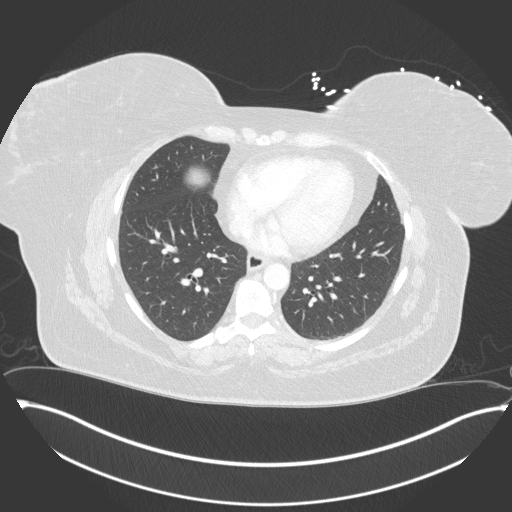
[im 85/158  lung]
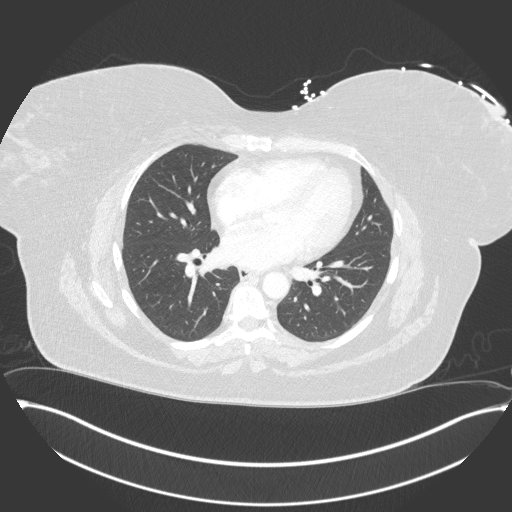
[im 97/158  lung]
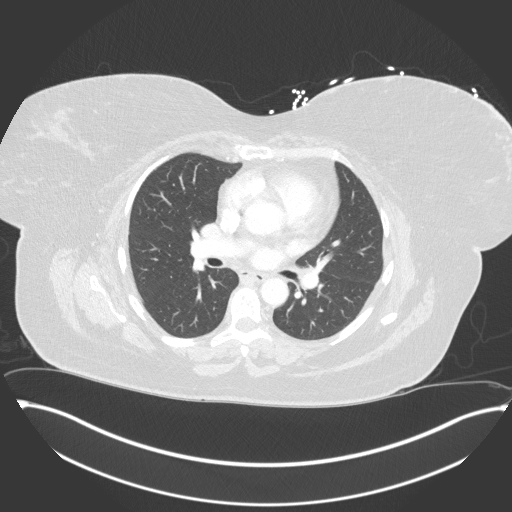
[im 109/158  mediastinal]
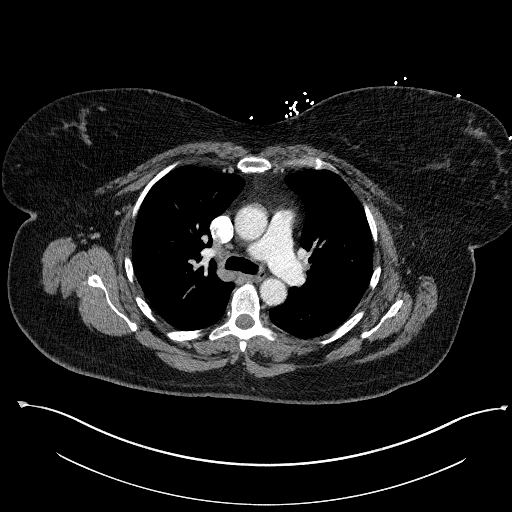
[im 109/158  lung]
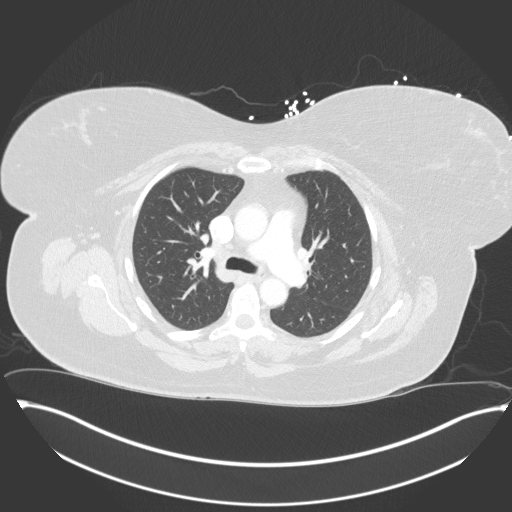
[im 121/158  lung]
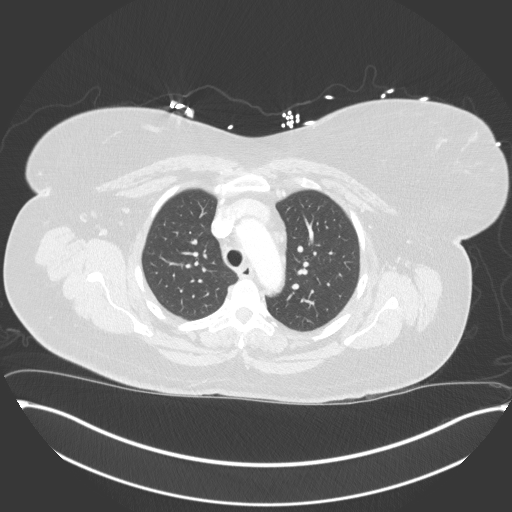
[im 133/158  lung]
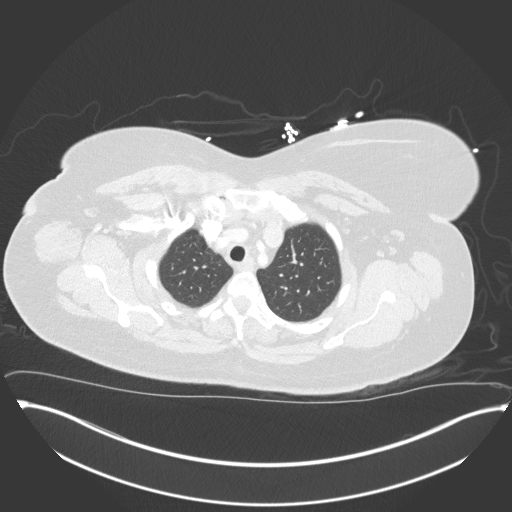
[im 145/158  lung]
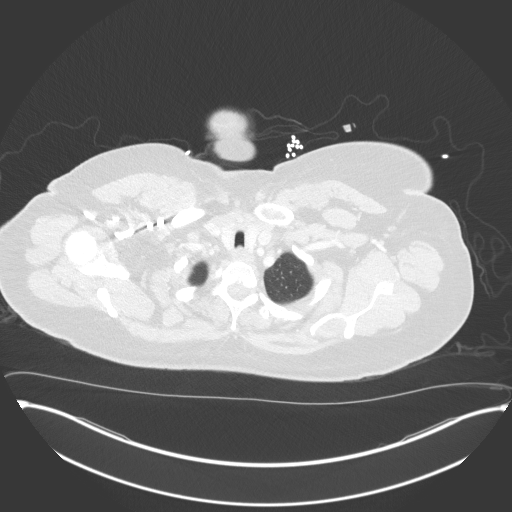

[Series 6: coronal · coronal · 0.70mm/px · 3 of 159 slices shown]
[im 32/159  lung]
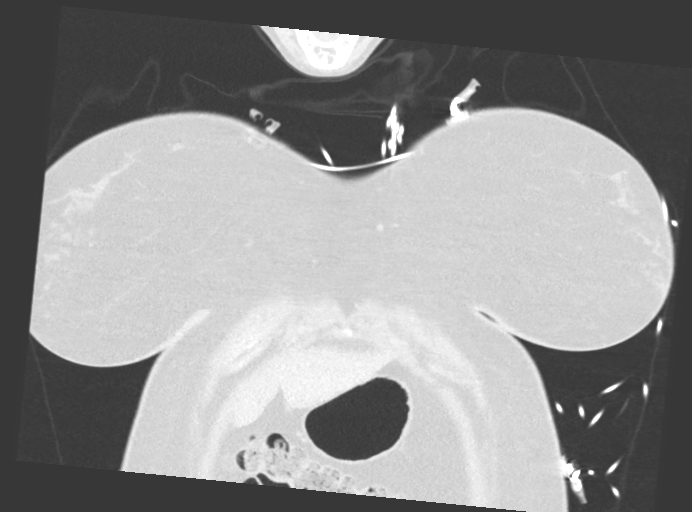
[im 64/159  lung]
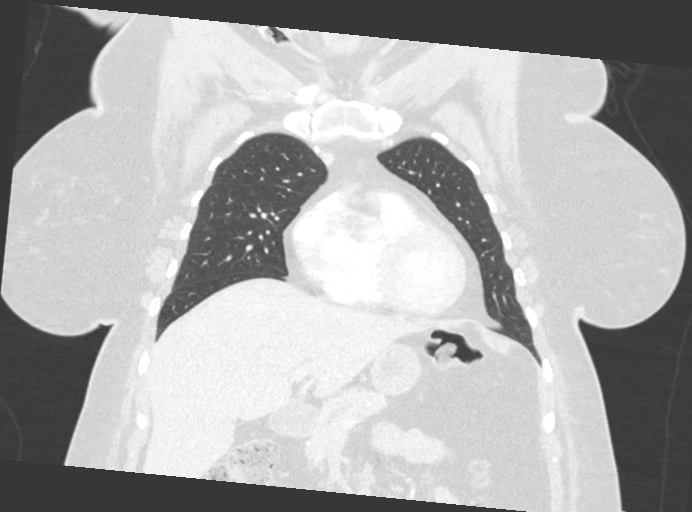
[im 95/159  lung]
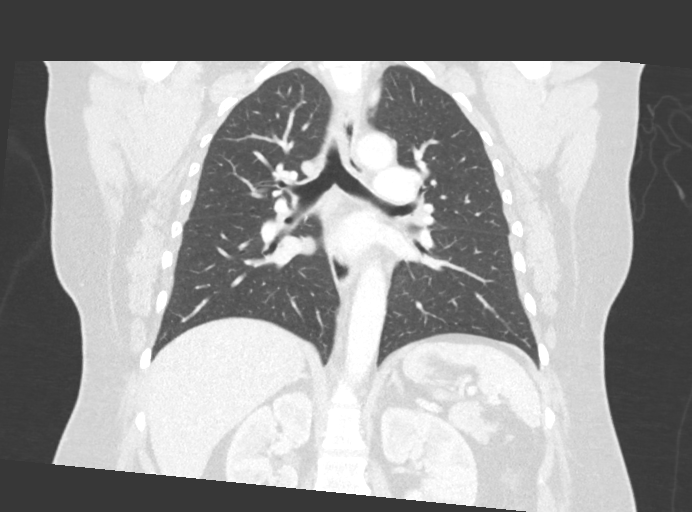

[15 of 36 positions shown; findings below may reference images not displayed]

RADIATION DOSE REDUCTION: This exam was performed according to the
departmental dose-optimization program which includes automated
exposure control, adjustment of the mA and/or kV according to
patient size and/or use of iterative reconstruction technique.

CONTRAST:  75mL OMNIPAQUE IOHEXOL 300 MG/ML  SOLN
FINDINGS: Cardiovascular: No significant vascular findings. Normal heart size.
No pericardial effusion.

Mediastinum/Nodes: No enlarged mediastinal, hilar, or axillary lymph
nodes. Thyroid gland, trachea, and esophagus demonstrate no
significant findings.

Lungs/Pleura: Lungs are clear. No pleural effusion or pneumothorax.

Upper Abdomen: No acute abnormality.  Status post cholecystectomy.

Musculoskeletal: No chest wall abnormality. No suspicious osseous
lesions identified.
IMPRESSION: No CT abnormality of the chest to correspond to abnormality queried
by prior chest radiograph. No evidence of mass or lymphadenopathy
with specific attention to the left suprahilar region, most likely
explained by superimposed pulmonary vessels.

## 2023-07-29 ENCOUNTER — Telehealth: Payer: Self-pay | Admitting: *Deleted

## 2023-07-29 NOTE — Telephone Encounter (Signed)
Patient called and states that she wants to move her vials back to [place] due to work. I did remind her of the days of operation and hours in [place]. Her vials will be there Friday. Patient verbalized understanding.

## 2023-07-31 ENCOUNTER — Encounter: Payer: Self-pay | Admitting: Allergy & Immunology

## 2023-07-31 ENCOUNTER — Ambulatory Visit (INDEPENDENT_AMBULATORY_CARE_PROVIDER_SITE_OTHER): Payer: Commercial Managed Care - PPO

## 2023-07-31 DIAGNOSIS — J309 Allergic rhinitis, unspecified: Secondary | ICD-10-CM | POA: Diagnosis not present

## 2023-08-04 ENCOUNTER — Other Ambulatory Visit (HOSPITAL_COMMUNITY): Payer: Self-pay

## 2023-08-05 ENCOUNTER — Other Ambulatory Visit (HOSPITAL_COMMUNITY): Payer: Self-pay

## 2023-08-05 MED ORDER — CYCLOBENZAPRINE HCL 10 MG PO TABS
10.0000 mg | ORAL_TABLET | Freq: Every day | ORAL | 5 refills | Status: AC
  Filled 2023-08-05: qty 30, 30d supply, fill #0

## 2023-08-06 ENCOUNTER — Other Ambulatory Visit (HOSPITAL_COMMUNITY): Payer: Self-pay

## 2023-08-07 ENCOUNTER — Other Ambulatory Visit (HOSPITAL_COMMUNITY): Payer: Self-pay

## 2023-08-07 ENCOUNTER — Ambulatory Visit (INDEPENDENT_AMBULATORY_CARE_PROVIDER_SITE_OTHER): Payer: Self-pay

## 2023-08-07 DIAGNOSIS — J309 Allergic rhinitis, unspecified: Secondary | ICD-10-CM

## 2023-08-08 ENCOUNTER — Other Ambulatory Visit (HOSPITAL_COMMUNITY): Payer: Self-pay

## 2023-08-10 ENCOUNTER — Other Ambulatory Visit (HOSPITAL_COMMUNITY): Payer: Self-pay

## 2023-08-13 ENCOUNTER — Other Ambulatory Visit (HOSPITAL_BASED_OUTPATIENT_CLINIC_OR_DEPARTMENT_OTHER): Payer: Self-pay

## 2023-08-13 DIAGNOSIS — R0683 Snoring: Secondary | ICD-10-CM

## 2023-08-19 ENCOUNTER — Other Ambulatory Visit (HOSPITAL_COMMUNITY): Payer: Self-pay

## 2023-08-21 ENCOUNTER — Encounter: Payer: Self-pay | Admitting: Family Medicine

## 2023-08-21 ENCOUNTER — Ambulatory Visit (INDEPENDENT_AMBULATORY_CARE_PROVIDER_SITE_OTHER): Payer: Commercial Managed Care - PPO

## 2023-08-21 DIAGNOSIS — J309 Allergic rhinitis, unspecified: Secondary | ICD-10-CM | POA: Diagnosis not present

## 2023-08-28 ENCOUNTER — Encounter: Payer: Self-pay | Admitting: Allergy & Immunology

## 2023-08-28 ENCOUNTER — Ambulatory Visit
Admission: EM | Admit: 2023-08-28 | Discharge: 2023-08-28 | Disposition: A | Discharge status: Home / Self Care | Payer: Commercial Managed Care - PPO | Attending: Nurse Practitioner | Admitting: Nurse Practitioner

## 2023-08-28 ENCOUNTER — Ambulatory Visit (INDEPENDENT_AMBULATORY_CARE_PROVIDER_SITE_OTHER): Payer: Self-pay

## 2023-08-28 DIAGNOSIS — R0981 Nasal congestion: Secondary | ICD-10-CM | POA: Insufficient documentation

## 2023-08-28 DIAGNOSIS — Z1152 Encounter for screening for COVID-19: Secondary | ICD-10-CM | POA: Insufficient documentation

## 2023-08-28 DIAGNOSIS — Z8709 Personal history of other diseases of the respiratory system: Secondary | ICD-10-CM | POA: Insufficient documentation

## 2023-08-28 DIAGNOSIS — J029 Acute pharyngitis, unspecified: Secondary | ICD-10-CM | POA: Insufficient documentation

## 2023-08-28 DIAGNOSIS — J309 Allergic rhinitis, unspecified: Secondary | ICD-10-CM

## 2023-08-28 LAB — POCT INFLUENZA A/B
Influenza A, POC: NEGATIVE
Influenza B, POC: NEGATIVE

## 2023-08-28 LAB — POCT RAPID STREP A (OFFICE): Rapid Strep A Screen: NEGATIVE

## 2023-08-28 NOTE — ED Provider Notes (Signed)
[facility]    CSN: [id number] Arrival date & time: 08/28/23  1136      History   Chief Complaint No chief complaint on file.   HPI Jane Wu is a 52 y.o. female.   The history is provided by the patient.   Patient presents with a 2-day history of sore throat, nasal congestion, and bodyaches.  Reports throat pain is worse at night and in the morning.  Patient denies fever, chills, ear pain, ear drainage, wheezing, difficulty breathing, chest pain, abdominal pain, nausea, vomiting, or diarrhea.  Patient reports that she did take a COVID test while she was at her allergist.  She states she waited for 10 minutes, the test appeared negative, states when she went back approximately 30 minutes later, the test looked as if it was positive.  Patient reports that her allergist office did tell her to come over for further testing.  Patient reports significant history of allergic rhinitis.  States that she did receive 2 allergy shots today.  Reports that she normally takes Xyzal, Flonase, and Singulair for her allergies.  Patient is requesting COVID and influenza testing.  Past Medical History:  Diagnosis Date   Anemia, unspecified    Asthma    Obesity     Patient Active Problem List   Diagnosis Date Noted   Anemia, unspecified    AMENORRHEA 05/27/2010   FATIGUE 05/27/2010   CELLULITIS 12/28/2009   DYSMENORRHEA 03/12/2009   MENORRHAGIA 03/12/2009   HIDRADENITIS SUPPURATIVA 03/12/2009   OBESITY 05/31/2008    Past Surgical History:  Procedure Laterality Date   CHOLECYSTECTOMY     right auxillary dissection for hydradenitis      OB History   No obstetric history on file.      Home Medications    Prior to Admission medications   Medication Sig Start Date End Date Taking? Authorizing Provider  ALPRAZolam (XANAX) 0.25 MG tablet Take 0.25 mg by mouth 3 (three) times daily as needed for anxiety. 08/10/19   [provider]  atorvastatin (LIPITOR) 20  MG tablet Take 1 tablet (20 mg total) by mouth daily for high cholesterol. 04/21/23     azelastine (ASTELIN) 0.1 % nasal spray Use 1-2 sprays in each nostril twice daily. Aim upward and outward 06/19/23   Birder Robson, MD  benzonatate (TESSALON) 100 MG capsule Take 1 capsule (100 mg total) by mouth 3 (three) times daily as needed for cough. 07/10/23   Birder Robson, MD  benzonatate (TESSALON) 200 MG capsule Take 200 mg by mouth 3 (three) times daily as needed. 04/21/23   [provider]  brompheniramine-pseudoephedrine-DM 30-2-10 MG/5ML syrup Take 5 mLs by mouth 4 (four) times daily as needed. 05/09/23   Leath-Warren, Sadie Haber, NP  cyclobenzaprine (FLEXERIL) 10 MG tablet Take 10 mg by mouth 3 (three) times daily as needed for muscle spasms. 40mg  2x a day 08/12/19   [provider]  cyclobenzaprine (FLEXERIL) 10 MG tablet Take 1 tablet (10 mg total) by mouth daily. 08/05/23     EPINEPHrine 0.3 mg/0.3 mL IJ SOAJ injection Inject into the muscle.    [provider]  fexofenadine (ALLEGRA ALLERGY CHILDRENS) 30 MG/5ML suspension Take 5 mLs (30 mg total) by mouth daily. 05/13/23   Birder Robson, MD  fexofenadine [facility] ALLERGY) 180 MG tablet Take 1 tablet (180 mg total) by mouth daily. 12/23/22   Birder Robson, MD  fluticasone (FLONASE) 50 MCG/ACT nasal spray Use 2 sprays each nostril daily.  Shake gently before use & aim upward and outward. 06/19/23   Birder Robson, MD  fluticasone (FLOVENT HFA) 110 MCG/ACT inhaler With respiratory illness or flare ups, start 2 puffs twice daily for 1-2 weeks. 05/13/23   Birder Robson, MD  furosemide (LASIX) 20 MG tablet Take 1 tablet (20 mg total) by mouth daily as needed. 02/20/23   Raspet, Noberto Retort, PA-C  HYDROcodone-acetaminophen (NORCO/VICODIN) 5-325 MG tablet Take 1 tablet by mouth as needed. 11/18/22   [provider]  hydrOXYzine (VISTARIL) 25 MG capsule as needed. 12/28/19   [provider]  ibuprofen (ADVIL) 800 MG tablet  Take 1 tablet (800 mg total) by mouth 3 (three) times daily. 11/03/22   Leath-Warren, Sadie Haber, NP  ipratropium (ATROVENT) 0.03 % nasal spray SMARTSIG:1-2 Spray(s) Both Nares 1-3 Times Daily PRN    [provider]  levalbuterol (XOPENEX HFA) 45 MCG/ACT inhaler Inhale 1 puff into the lungs every 6 hours as needed for wheezing or shortness of breath. 06/19/23   Birder Robson, MD  levocetirizine (XYZAL) 5 MG tablet Take 1 tablet (5 mg total) by mouth every evening. 06/19/23   Birder Robson, MD  montelukast (SINGULAIR) 10 MG tablet Take 1 tablet (10 mg total) by mouth at bedtime. 06/19/23   Birder Robson, MD  omeprazole (PRILOSEC) 40 MG capsule Take 1 capsule (40 mg total) by mouth 2 (two) times daily 30 minutes before morning and evening meals 08/28/22     ondansetron (ZOFRAN) 4 MG tablet Take 1 tablet (4 mg total) by mouth every 8 (eight) hours as needed for nausea or vomiting. 11/03/22   Leath-Warren, Sadie Haber, NP  promethazine (PHENERGAN) 25 MG tablet Take 25 mg by mouth every 6 (six) hours as needed. 11/05/22   [provider]  triamcinolone (NASACORT) 55 MCG/ACT AERO nasal inhaler Place 2 sprays into the nose daily. 02/01/22   Jacalyn Lefevre, MD    Family History Family History  Problem Relation Age of Onset   Asthma Mother    Hypertension Mother        [place]    GER disease Mother    Asthma Brother     Social History Social History   Tobacco Use   Smoking status: Never    Passive exposure: Never   Smokeless tobacco: Never  Vaping Use   Vaping status: Never Used  Substance Use Topics   Alcohol use: Not Currently   Drug use: Never     Allergies   Bactrim [sulfamethoxazole-trimethoprim] and Morphine and codeine   Review of Systems Review of Systems Per HPI  Physical Exam Triage Vital Signs ED Triage Vitals  Encounter Vitals Group     BP 08/28/23 1212 (!) 128/90     Systolic BP Percentile --      Diastolic BP Percentile --      Pulse Rate 08/28/23  1212 83     Resp 08/28/23 1212 20     Temp 08/28/23 1212 98.4 F (36.9 C)     Temp Source 08/28/23 1212 Oral     SpO2 08/28/23 1212 97 %     Weight --      Height --      Head Circumference --      Peak Flow --      Pain Score 08/28/23 1214 0     Pain Loc --      Pain Education --      Exclude from Growth Chart --    No  data found.  Updated Vital Signs BP (!) 128/90 (BP Location: Right Arm)   Pulse 83   Temp 98.4 F (36.9 C) (Oral)   Resp 20   SpO2 97%   Visual Acuity Right Eye Distance:   Left Eye Distance:   Bilateral Distance:    Right Eye Near:   Left Eye Near:    Bilateral Near:     Physical Exam Vitals and nursing note reviewed.  Constitutional:      General: She is not in acute distress.    Appearance: Normal appearance.  HENT:     Head: Normocephalic.     Right Ear: Tympanic membrane, ear canal and external ear normal.     Left Ear: Tympanic membrane, ear canal and external ear normal.     Nose: Congestion present.     Right Turbinates: Enlarged and swollen.     Left Turbinates: Enlarged and swollen.     Right Sinus: No maxillary sinus tenderness or frontal sinus tenderness.     Left Sinus: No maxillary sinus tenderness or frontal sinus tenderness.     Mouth/Throat:     Lips: Pink.     Mouth: Mucous membranes are moist.     Pharynx: Uvula midline. Posterior oropharyngeal erythema and postnasal drip present. No oropharyngeal exudate.     Comments: Cobblestoning present to posterior oropharynx  Eyes:     Extraocular Movements: Extraocular movements intact.     Conjunctiva/sclera: Conjunctivae normal.     Pupils: Pupils are equal, round, and reactive to light.  Cardiovascular:     Rate and Rhythm: Normal rate and regular rhythm.     Pulses: Normal pulses.     Heart sounds: Normal heart sounds.  Pulmonary:     Effort: Pulmonary effort is normal. No respiratory distress.     Breath sounds: Normal breath sounds. No stridor. No wheezing, rhonchi or  rales.  Abdominal:     General: Bowel sounds are normal.     Palpations: Abdomen is soft.     Tenderness: There is no abdominal tenderness.  Musculoskeletal:     Cervical back: Normal range of motion.  Lymphadenopathy:     Cervical: No cervical adenopathy.  Skin:    General: Skin is warm and dry.  Neurological:     General: No focal deficit present.     Mental Status: She is alert and oriented to person, place, and time.  Psychiatric:        Mood and Affect: Mood normal.        Behavior: Behavior normal.      UC Treatments / Results  Labs (all labs ordered are listed, but only abnormal results are displayed) Labs Reviewed  CULTURE, GROUP A STREP ([facility])  SARS CORONAVIRUS 2 (TAT 6-24 HRS)  POCT RAPID STREP A (OFFICE)  POCT INFLUENZA A/B    EKG   Radiology No results found.  Procedures Procedures (including critical care time)  Medications Ordered in UC Medications - No data to display  Initial Impression / Assessment and Plan / UC Course  I have reviewed the triage vital signs and the nursing notes.  Pertinent labs & imaging results that were available during my care of the patient were reviewed by me and considered in my medical decision making (see chart for details).  The rapid strep test and influenza test was negative.  Throat culture and COVID test are pending.  On exam, patient with moderate postnasal drainage, do suspect this may be contributing to her throat pain.  Patient is able to receive Paxlovid if her COVID test is positive.  Supportive care recommendations were provided and discussed with the patient to include continuing her current allergy medications, fluids, rest, warm salt water gargles, and a soft diet.  Patient was given indications of a follow-up be necessary.  Patient was in agreement with this plan of care and verbalized understanding.  All questions were answered.  Patient stable for discharge.  Work note was provided.  Final Clinical  Impressions(s) / UC Diagnoses   Final diagnoses:  Sore throat  Nasal congestion  History of allergic rhinitis  Encounter for screening for COVID-19     Discharge Instructions      The rapid strep test and influenza test were negative.  A throat culture and COVID test are pending.  You will be contacted if the pending test results are abnormal.  You also have access to the results via MyChart. Continue your current allergy medications. Increase fluids and allow for plenty of rest. Warm salt water gargles 3-4 times daily as needed for throat pain or discomfort. Recommend a soft diet to include soup, broth, yogurt, pudding, or Jell-O while symptoms persist. Recommend normal saline nasal spray throughout the day to help with nasal congestion. If symptoms do not improve over the next 7 to 10 days, or if they suddenly worsen, you may follow-up in this clinic or with your  PCP for further evaluation. Follow-up as needed.      ED Prescriptions   None    PDMP not reviewed this encounter.   Abran Cantor, NP 08/28/23 1347

## 2023-08-28 NOTE — ED Triage Notes (Signed)
Pt reports she has a sore throat and nasal congestin x 2 days

## 2023-08-28 NOTE — Discharge Instructions (Addendum)
The rapid strep test and influenza test were negative.  A throat culture and COVID test are pending.  You will be contacted if the pending test results are abnormal.  You also have access to the results via MyChart. Continue your current allergy medications. Increase fluids and allow for plenty of rest. Warm salt water gargles 3-4 times daily as needed for throat pain or discomfort. Recommend a soft diet to include soup, broth, yogurt, pudding, or Jell-O while symptoms persist. Recommend normal saline nasal spray throughout the day to help with nasal congestion. If symptoms do not improve over the next 7 to 10 days, or if they suddenly worsen, you may follow-up in this clinic or with your  PCP for further evaluation. Follow-up as needed.

## 2023-08-29 LAB — SARS CORONAVIRUS 2 (TAT 6-24 HRS): SARS Coronavirus 2: NEGATIVE

## 2023-08-31 LAB — CULTURE, GROUP A STREP (THRC)

## 2023-09-04 ENCOUNTER — Ambulatory Visit (INDEPENDENT_AMBULATORY_CARE_PROVIDER_SITE_OTHER): Payer: Self-pay

## 2023-09-04 ENCOUNTER — Encounter: Payer: Self-pay | Admitting: Allergy & Immunology

## 2023-09-04 DIAGNOSIS — J309 Allergic rhinitis, unspecified: Secondary | ICD-10-CM

## 2023-09-11 ENCOUNTER — Ambulatory Visit (INDEPENDENT_AMBULATORY_CARE_PROVIDER_SITE_OTHER): Payer: Self-pay

## 2023-09-11 DIAGNOSIS — J309 Allergic rhinitis, unspecified: Secondary | ICD-10-CM

## 2023-09-21 ENCOUNTER — Encounter: Payer: Self-pay | Admitting: Internal Medicine

## 2023-09-21 ENCOUNTER — Ambulatory Visit (INDEPENDENT_AMBULATORY_CARE_PROVIDER_SITE_OTHER): Payer: Self-pay | Admitting: Internal Medicine

## 2023-09-21 ENCOUNTER — Other Ambulatory Visit (HOSPITAL_COMMUNITY): Payer: Self-pay

## 2023-09-21 ENCOUNTER — Ambulatory Visit: Payer: Commercial Managed Care - PPO | Admitting: Internal Medicine

## 2023-09-21 ENCOUNTER — Other Ambulatory Visit: Payer: Self-pay

## 2023-09-21 VITALS — BP 120/88 | HR 87 | Temp 97.9°F | Wt 201.8 lb

## 2023-09-21 DIAGNOSIS — J4520 Mild intermittent asthma, uncomplicated: Secondary | ICD-10-CM

## 2023-09-21 DIAGNOSIS — J3089 Other allergic rhinitis: Secondary | ICD-10-CM

## 2023-09-21 DIAGNOSIS — J329 Chronic sinusitis, unspecified: Secondary | ICD-10-CM

## 2023-09-21 DIAGNOSIS — K219 Gastro-esophageal reflux disease without esophagitis: Secondary | ICD-10-CM

## 2023-09-21 DIAGNOSIS — J302 Other seasonal allergic rhinitis: Secondary | ICD-10-CM

## 2023-09-21 MED ORDER — LEVOCETIRIZINE DIHYDROCHLORIDE 5 MG PO TABS
5.0000 mg | ORAL_TABLET | Freq: Every evening | ORAL | 5 refills | Status: DC
  Filled 2023-09-21 – 2023-10-29 (×2): qty 30, 30d supply, fill #0
  Filled 2023-12-14: qty 30, 30d supply, fill #1

## 2023-09-21 MED ORDER — AZELASTINE HCL 0.1 % NA SOLN
NASAL | 5 refills | Status: DC
  Filled 2023-09-21: qty 30, 30d supply, fill #0

## 2023-09-21 MED ORDER — FLUTICASONE PROPIONATE 50 MCG/ACT NA SUSP
NASAL | 5 refills | Status: DC
Start: 2023-09-21 — End: 2024-01-25
  Filled 2023-09-21: qty 16, 30d supply, fill #0

## 2023-09-21 MED ORDER — FLUTICASONE PROPIONATE HFA 110 MCG/ACT IN AERO
INHALATION_SPRAY | RESPIRATORY_TRACT | 1 refills | Status: DC
Start: 2023-09-21 — End: 2024-01-25
  Filled 2023-09-21: qty 12, 30d supply, fill #0

## 2023-09-21 MED ORDER — MONTELUKAST SODIUM 10 MG PO TABS
10.0000 mg | ORAL_TABLET | Freq: Every day | ORAL | 5 refills | Status: DC
  Filled 2023-09-21 – 2023-10-29 (×2): qty 30, 30d supply, fill #0
  Filled 2023-12-14: qty 30, 30d supply, fill #1

## 2023-09-21 NOTE — Patient Instructions (Addendum)
Allergic Rhinitis and Allergic Conjunctivitis Chronic Cough with Post Nasal Drainage - Positive skin test 08/2022: grasses, weeds, molds - Avoidance measures discussed. - Use nasal saline rinses before nose sprays such as with Neilmed Sinus Rinse over the counter bottle.  Use distilled water.   - Use Flonase 1 spray each nostril twice daily. Aim upward and outward. - Use Azelastine 1-2 sprays each nostril twice daily. Aim upward and outward. - Use Xyzal 5mg  daily in PM.  Use Allegra 30mg  daily in AM as needed and definitely on the days you get your allergy shot.   - Use Singulair 10mg  daily.  Stop this if you have any behavioral or mood changes.   - Continue allergy shots on schedule and Bring Epipen with you at each shot visit.   Frequent Sinus Infections - Received Pneumovax shot 8/16.  Repeat titers with boosted response.   Mild Intermittent Asthma - With respiratory illness or flare ups, use Flovent 2 puffs twice daily for 1-2 weeks.   - Maintenance inhaler: none - Rescue inhaler: Xopenex 2 puffs as needed for respiratory symptoms of shortness of breath, or wheezing Asthma control goals:  Full participation in all desired activities (may need albuterol before activity) Albuterol use two times or less a week on average (not counting use with activity) Cough interfering with sleep two times or less a month Oral steroids no more than once a year No hospitalizations  GERD -Continue Omeprazole 40mg  daily. -Avoid lying down for at least two hours after a meal or after drinking acidic beverages, like soda, or other caffeinated beverages. This can help to prevent stomach contents from flowing back into the esophagus. -Keep your head elevated while you sleep. Using an extra pillow or two can also help to prevent reflux. -Eat smaller and more frequent meals each day instead of a few large meals. This promotes digestion and can aid in preventing heartburn. -Wear loose-fitting clothes to  ease pressure on the stomach, which can worsen heartburn and reflux. -Reduce excess weight around the midsection. This can ease pressure on the stomach. Such pressure can force some stomach contents back up the esophagus.

## 2023-09-21 NOTE — Progress Notes (Signed)
FOLLOW UP Date of Service/Encounter:  09/21/23   Subjective:  Jane Wu (DOB: [birth year]) is a 52 y.o. female who [facility] for follow up for asthma, allergic rhinitis, frequent sinus infections, reflux   History obtained from: chart review and patient. Last visit was with me on 06/19/2023: having frequent PND, runny nose, cough. Discussed using Flovent PRN.  Also on Flonase, Azelastine, Xyzal, Singulair.  Also plan to repeat S pneumo titers.   Since last visit, reports having some trouble with ears causing muffled sounds and popping.  Also still having runny nose, drainage, frequent throat clearing with cough.  Not much SOB/wheezing.Did have 1 urgent care visit for sore throat, thought to be viral illness, symptomatic care at home. Using Flonase, Azelastine, Xyzal, Singulair daily.  Has not needed Xopenex or Flovent much. Reflux is doing well on PPI daily, not much heartburn.   Past Medical History: Past Medical History:  Diagnosis Date   Anemia, unspecified    Asthma    Obesity     Objective:  There were no vitals taken for this visit. There is no height or weight on file to calculate BMI. Physical Exam: GEN: alert, well developed HEENT: clear conjunctiva, TM grey and translucent, nose with mild inferior turbinate hypertrophy, pink nasal mucosa, clear rhinorrhea, slight cobblestoning HEART: regular rate and rhythm, no murmur LUNGS: clear to auscultation bilaterally, no coughing, unlabored respiration SKIN: no rashes or lesions  Spirometry:  Tracings reviewed. Her effort: It was hard to get consistent efforts and there is a question as to whether this reflects a maximal maneuver. FVC: 1.8L, 61% predicted  FEV1: 1.79L, 76% predicted FEV1/FVC ratio: 99% Interpretation: Spirometry consistent with possible restrictive disease.  Please see scanned spirometry results for details.  Assessment:   1. Recurrent sinus infections    2. Gastroesophageal reflux disease, unspecified whether esophagitis present   3. Seasonal and perennial allergic rhinitis   4. Mild intermittent asthma without complication     Plan/Recommendations:  Allergic Rhinitis and Allergic Conjunctivitis Chronic Cough with Post Nasal Drainage - Uncontrolled, continue AIT and meds as discussed.  - Positive skin test 08/2022: grasses, weeds, molds - Avoidance measures discussed. - Use nasal saline rinses before nose sprays such as with Neilmed Sinus Rinse over the counter bottle.  Use distilled water.   - Use Flonase 1 spray each nostril twice daily. Aim upward and outward. - Use Azelastine 1-2 sprays each nostril twice daily. Aim upward and outward. - Use Xyzal 5mg  daily in PM.  Use Allegra 30mg  daily in AM as needed and definitely on the days you get your allergy shot.   - Use Singulair 10mg  daily.  Stop this if you have any behavioral or mood changes.   - Continue allergy shots on schedule and Bring Epipen with you at each shot visit.   Frequent Sinus Infections -  Low S pneumo titers. Ig were normal. Received Pneumovax shot 8/16.  Repeat titers with boosted response.   Mild Intermittent Asthma - Spirometry with suboptimal effort, questionable restriction likely effort related.   - With respiratory illness or flare ups, use Flovent 2 puffs twice daily for 1-2 weeks.   - Maintenance inhaler: none - Rescue inhaler: Xopenex 2 puffs as needed for respiratory symptoms of shortness of breath, or wheezing Asthma control goals:  Full participation in all desired activities (may need albuterol before activity) Albuterol use two times or less a week on average (not counting use with  activity) Cough interfering with sleep two times or less a month Oral steroids no more than once a year No hospitalizations  GERD - Controlled  -Continue Omeprazole 40mg  daily. -Avoid lying down for at least two hours after a meal or after drinking acidic  beverages, like soda, or other caffeinated beverages. This can help to prevent stomach contents from flowing back into the esophagus. -Keep your head elevated while you sleep. Using an extra pillow or two can also help to prevent reflux. -Eat smaller and more frequent meals each day instead of a few large meals. This promotes digestion and can aid in preventing heartburn. -Wear loose-fitting clothes to ease pressure on the stomach, which can worsen heartburn and reflux. -Reduce excess weight around the midsection. This can ease pressure on the stomach. Such pressure can force some stomach contents back up the esophagus.     Return in about 4 months (around 01/20/2024).  Alesia Morin, MD [facility]    '

## 2023-09-22 ENCOUNTER — Other Ambulatory Visit: Payer: Self-pay

## 2023-09-22 ENCOUNTER — Other Ambulatory Visit (HOSPITAL_COMMUNITY): Payer: Self-pay

## 2023-09-25 ENCOUNTER — Ambulatory Visit: Payer: Commercial Managed Care - PPO | Admitting: Internal Medicine

## 2023-09-28 ENCOUNTER — Other Ambulatory Visit (HOSPITAL_COMMUNITY): Payer: Self-pay

## 2023-09-30 ENCOUNTER — Ambulatory Visit (INDEPENDENT_AMBULATORY_CARE_PROVIDER_SITE_OTHER): Payer: Self-pay

## 2023-09-30 ENCOUNTER — Encounter: Payer: Self-pay | Admitting: Allergy & Immunology

## 2023-09-30 DIAGNOSIS — J309 Allergic rhinitis, unspecified: Secondary | ICD-10-CM

## 2023-10-03 ENCOUNTER — Other Ambulatory Visit (HOSPITAL_COMMUNITY): Payer: Self-pay

## 2023-10-23 ENCOUNTER — Other Ambulatory Visit (HOSPITAL_COMMUNITY): Payer: Self-pay

## 2023-10-23 ENCOUNTER — Ambulatory Visit (INDEPENDENT_AMBULATORY_CARE_PROVIDER_SITE_OTHER): Payer: Self-pay

## 2023-10-23 DIAGNOSIS — J309 Allergic rhinitis, unspecified: Secondary | ICD-10-CM

## 2023-10-26 ENCOUNTER — Other Ambulatory Visit (HOSPITAL_COMMUNITY): Payer: Self-pay

## 2023-10-26 MED ORDER — ATORVASTATIN CALCIUM 20 MG PO TABS
20.0000 mg | ORAL_TABLET | Freq: Every day | ORAL | 1 refills | Status: AC
  Filled 2023-10-26: qty 30, 30d supply, fill #0
  Filled 2023-12-14: qty 30, 30d supply, fill #1

## 2023-10-26 MED ORDER — ATORVASTATIN CALCIUM 20 MG PO TABS
20.0000 mg | ORAL_TABLET | Freq: Every day | ORAL | 1 refills | Status: AC
  Filled 2023-10-26: qty 30, 30d supply, fill #0

## 2023-10-28 ENCOUNTER — Ambulatory Visit (INDEPENDENT_AMBULATORY_CARE_PROVIDER_SITE_OTHER): Payer: Self-pay

## 2023-10-28 DIAGNOSIS — J309 Allergic rhinitis, unspecified: Secondary | ICD-10-CM | POA: Diagnosis not present

## 2023-10-29 ENCOUNTER — Other Ambulatory Visit (HOSPITAL_COMMUNITY): Payer: Self-pay

## 2023-11-04 ENCOUNTER — Ambulatory Visit (INDEPENDENT_AMBULATORY_CARE_PROVIDER_SITE_OTHER): Payer: Self-pay

## 2023-11-04 DIAGNOSIS — J309 Allergic rhinitis, unspecified: Secondary | ICD-10-CM | POA: Diagnosis not present

## 2023-11-13 ENCOUNTER — Ambulatory Visit (INDEPENDENT_AMBULATORY_CARE_PROVIDER_SITE_OTHER): Payer: Self-pay

## 2023-11-13 DIAGNOSIS — J309 Allergic rhinitis, unspecified: Secondary | ICD-10-CM

## 2023-12-02 ENCOUNTER — Ambulatory Visit (INDEPENDENT_AMBULATORY_CARE_PROVIDER_SITE_OTHER): Payer: Self-pay

## 2023-12-02 DIAGNOSIS — J309 Allergic rhinitis, unspecified: Secondary | ICD-10-CM | POA: Diagnosis not present

## 2023-12-11 ENCOUNTER — Ambulatory Visit (INDEPENDENT_AMBULATORY_CARE_PROVIDER_SITE_OTHER): Payer: Self-pay

## 2023-12-11 DIAGNOSIS — J309 Allergic rhinitis, unspecified: Secondary | ICD-10-CM

## 2023-12-14 ENCOUNTER — Other Ambulatory Visit: Payer: Self-pay | Admitting: Internal Medicine

## 2023-12-14 ENCOUNTER — Other Ambulatory Visit (HOSPITAL_COMMUNITY): Payer: Self-pay

## 2023-12-14 MED ORDER — BENZONATATE 100 MG PO CAPS
100.0000 mg | ORAL_CAPSULE | Freq: Three times a day (TID) | ORAL | 0 refills | Status: DC | PRN
  Filled 2023-12-14: qty 15, 5d supply, fill #0

## 2023-12-24 ENCOUNTER — Other Ambulatory Visit (HOSPITAL_COMMUNITY): Payer: Self-pay

## 2023-12-30 ENCOUNTER — Ambulatory Visit (INDEPENDENT_AMBULATORY_CARE_PROVIDER_SITE_OTHER): Payer: Self-pay

## 2023-12-30 DIAGNOSIS — J309 Allergic rhinitis, unspecified: Secondary | ICD-10-CM | POA: Diagnosis not present

## 2024-01-13 ENCOUNTER — Ambulatory Visit (INDEPENDENT_AMBULATORY_CARE_PROVIDER_SITE_OTHER): Payer: Self-pay

## 2024-01-13 DIAGNOSIS — J309 Allergic rhinitis, unspecified: Secondary | ICD-10-CM

## 2024-01-18 DIAGNOSIS — J301 Allergic rhinitis due to pollen: Secondary | ICD-10-CM | POA: Diagnosis not present

## 2024-01-18 NOTE — Progress Notes (Signed)
 VIALS MADE 01-18-24

## 2024-01-19 DIAGNOSIS — J302 Other seasonal allergic rhinitis: Secondary | ICD-10-CM | POA: Diagnosis not present

## 2024-01-24 ENCOUNTER — Other Ambulatory Visit: Payer: Self-pay

## 2024-01-24 ENCOUNTER — Encounter: Payer: Self-pay | Admitting: Emergency Medicine

## 2024-01-24 ENCOUNTER — Ambulatory Visit
Admission: EM | Admit: 2024-01-24 | Discharge: 2024-01-24 | Disposition: A | Discharge status: Home / Self Care | Attending: Family Medicine | Admitting: Family Medicine

## 2024-01-24 ENCOUNTER — Ambulatory Visit: Payer: Self-pay

## 2024-01-24 DIAGNOSIS — M722 Plantar fascial fibromatosis: Secondary | ICD-10-CM

## 2024-01-24 MED ORDER — CYCLOBENZAPRINE HCL 5 MG PO TABS: 5.0000 mg | ORAL_TABLET | Freq: Three times a day (TID) | ORAL | 0 refills | Status: AC | PRN

## 2024-01-24 MED ORDER — DEXAMETHASONE SODIUM PHOSPHATE 10 MG/ML IJ SOLN
10.0000 mg | Freq: Once | INTRAMUSCULAR | Status: AC
  Administered 2024-01-24: 10 mg via INTRAMUSCULAR

## 2024-01-24 NOTE — ED Triage Notes (Addendum)
 Pt reports burning/tingling sensation with intermittent stabbing pain with ambulation of right foot x3 weeks. Denies any known injury but reports intense pain with weight bearing.   Pt feet elevated for comfort.

## 2024-01-25 ENCOUNTER — Ambulatory Visit (INDEPENDENT_AMBULATORY_CARE_PROVIDER_SITE_OTHER): Payer: Self-pay | Admitting: Internal Medicine

## 2024-01-25 VITALS — BP 120/80 | HR 100 | Temp 98.1°F | Resp 16

## 2024-01-25 DIAGNOSIS — J302 Other seasonal allergic rhinitis: Secondary | ICD-10-CM | POA: Diagnosis not present

## 2024-01-25 DIAGNOSIS — J329 Chronic sinusitis, unspecified: Secondary | ICD-10-CM

## 2024-01-25 DIAGNOSIS — J4520 Mild intermittent asthma, uncomplicated: Secondary | ICD-10-CM | POA: Diagnosis not present

## 2024-01-25 DIAGNOSIS — J3089 Other allergic rhinitis: Secondary | ICD-10-CM | POA: Diagnosis not present

## 2024-01-25 DIAGNOSIS — J309 Allergic rhinitis, unspecified: Secondary | ICD-10-CM

## 2024-01-25 MED ORDER — FLUTICASONE PROPIONATE 50 MCG/ACT NA SUSP: 2.0000 | Freq: Every day | NASAL | 5 refills | Status: DC

## 2024-01-25 MED ORDER — EPINEPHRINE 0.3 MG/0.3ML IJ SOAJ: 0.3000 mg | INTRAMUSCULAR | 1 refills | Status: DC | PRN

## 2024-01-25 MED ORDER — LEVALBUTEROL TARTRATE 45 MCG/ACT IN AERO: 1.0000 | INHALATION_SPRAY | Freq: Four times a day (QID) | RESPIRATORY_TRACT | 0 refills | Status: DC | PRN

## 2024-01-25 MED ORDER — AZELASTINE HCL 0.1 % NA SOLN: 2.0000 | Freq: Two times a day (BID) | NASAL | 5 refills | Status: DC

## 2024-01-25 MED ORDER — FLUTICASONE PROPIONATE HFA 110 MCG/ACT IN AERO: INHALATION_SPRAY | RESPIRATORY_TRACT | 1 refills | Status: DC

## 2024-01-25 MED ORDER — LEVOCETIRIZINE DIHYDROCHLORIDE 5 MG PO TABS: 5.0000 mg | ORAL_TABLET | Freq: Every evening | ORAL | 5 refills | Status: DC

## 2024-01-25 MED ORDER — MONTELUKAST SODIUM 10 MG PO TABS: 10.0000 mg | ORAL_TABLET | Freq: Every day | ORAL | 5 refills | Status: DC

## 2024-01-25 NOTE — Patient Instructions (Addendum)
 Allergic Rhinitis - Remember to come once a week for allergy shot to help build up on time.   - Positive skin test 08/2022: grasses, weeds, molds - Use nasal saline rinses before nose sprays such as with Neilmed Sinus Rinse over the counter bottle.  Use distilled water.   - Use Flonase 1-2 spray each nostril twice daily. Aim upward and outward. - Use Azelastine 1-2 sprays each nostril twice daily. Aim upward and outward. - Use Xyzal 5mg  daily in PM.  Use Allegra 30mg  daily in AM as needed and definitely on the days you get your allergy shot.   - Use Singulair 10mg  daily.  Stop this if you have any behavioral or mood changes.   - Continue allergy shots on schedule and Bring Epipen with you at each shot visit. Initiated 01/2023.    Frequent Sinus Infections - Low S pneumo titers. Ig were normal. Received Pneumovax shot 8/16.  Repeat titers with boosted response.   Mild Intermittent Asthma - With respiratory illness or flare ups, use Flovent 2 puffs twice daily for 1-2 weeks.   - Rescue inhaler: Xopenex 2 puffs every 6 hours as needed for respiratory symptoms of shortness of breath, or wheezing Asthma control goals:  Full participation in all desired activities (may need albuterol before activity) Albuterol use two times or less a week on average (not counting use with activity) Cough interfering with sleep two times or less a month Oral steroids no more than once a year No hospitalizations

## 2024-01-25 NOTE — Progress Notes (Unsigned)
 FOLLOW UP Date of Service/Encounter:  01/25/24   Subjective:  Jane Wu (DOB: Jan 13, 1971) is a 53 y.o. female who [facility] for follow up for allergic rhinitis, recurrent sinus infection and, intermittent asthma.   History obtained from: chart review and patient. Last visit was with me on 09/21/2023 and at the time, rhinitis was uncontrolled, discussed continuation of AIT and use of allergy medications. On PRN Flovent for illness/flare ups.  Started AIT 01/2023 but still has not reached maintained; vials were remixed on 3/31. Has an Epipen. Reports still having frequent cough with post nasal drainage and feeling of gritty sensation in throat.  Also with congestion, runny nose, itchy eyes. Also on Xyzal and Singulair, not using nose sprays.   Not having much trouble with asthma, rarely needs her rescue Xopenex.  Has not needed the Flovent much either.  In the past, we did trial daily use of ICS to see if it helped with cough but it did not.    Past Medical History: Past Medical History:  Diagnosis Date   Anemia, unspecified    Asthma    Obesity     Objective:  BP 120/80   Pulse 100   Temp 98.1 F (36.7 C)   Resp 16   SpO2 96%  There is no height or weight on file to calculate BMI. Physical Exam: GEN: alert, well developed HEENT: clear conjunctiva, nose with mild inferior turbinate hypertrophy, pink nasal mucosa, clear rhinorrhea, slight cobblestoning HEART: regular rate and rhythm, no murmur LUNGS: clear to auscultation bilaterally, no coughing, unlabored respiration SKIN: no rashes or lesions  Spirometry:  Tracings reviewed. Her effort: Good reproducible efforts. FVC: 2.12L, 72% predicted  FEV1: 1.77L, 75% predicted FEV1/FVC ratio: 83% Interpretation: Spirometry consistent with normal pattern.  Please see scanned spirometry results for details.  Assessment:   1. Seasonal and perennial allergic rhinitis   2.  Recurrent sinus infections   3. Mild intermittent asthma without complication   4. Allergic rhinitis, unspecified seasonality, unspecified trigger     Plan/Recommendations:  Allergic Rhinitis - Uncontrolled, discussed restarting nose sprays and to build up AIT for efficacy by coming weekly.   - Positive skin test 08/2022: grasses, weeds, molds - Use nasal saline rinses before nose sprays such as with Neilmed Sinus Rinse over the counter bottle.  Use distilled water.   - Use Flonase 1-2 spray each nostril twice daily. Aim upward and outward. - Use Azelastine 1-2 sprays each nostril twice daily. Aim upward and outward. - Use Xyzal 5mg  daily in PM.  Use Allegra 30mg  daily in AM as needed and definitely on the days you get your allergy shot.   - Use Singulair 10mg  daily.  Stop this if you have any behavioral or mood changes.   - Continue allergy shots on schedule and Bring Epipen with you at each shot visit. Initiated 01/2023.    Frequent Sinus Infections -  Low S pneumo titers. Ig were normal. Received Pneumovax shot 8/16.  Repeat titers with boosted response.   Mild Intermittent Asthma - Discussed chronic cough is related to uncontrolled upper airway as she has cobblestoning with PND and chronic cough has not responded to Xopenex/Flovent.Marland Kitchen  Spirometry today was normal, MDI technique discussed.  - With respiratory illness or flare ups, use Flovent 2 puffs twice daily for 1-2 weeks.   - Rescue inhaler: Xopenex 2 puffs every 6 hours as needed for respiratory symptoms of shortness of breath, or  wheezing Asthma control goals:  Full participation in all desired activities (may need albuterol before activity) Albuterol use two times or less a week on average (not counting use with activity) Cough interfering with sleep two times or less a month Oral steroids no more than once a year No hospitalizations    Return in about 6 months (around 07/26/2024).  Alesia Morin, MD [facility]

## 2024-01-27 ENCOUNTER — Ambulatory Visit: Payer: Self-pay

## 2024-01-28 NOTE — ED Provider Notes (Signed)
 [facility]    CSN: [id number] Arrival date & time: 01/24/24  1359      History   Chief Complaint Chief Complaint  Patient presents with   Foot Pain    HPI Jane Wu is a 53 y.o. female.   Patient presenting today with 3-week history of pain, tingling, stabbing sensation to the bottom of the right foot near the heel.  Denies any known injury, discoloration, loss of range of motion, skin changes.  States history of plantar fasciitis and that this feels similar.  Pain is worse in the morning and gets a bit better throughout the day.  Trying elevation and other supportive measures with minimal relief.    Past Medical History:  Diagnosis Date   Anemia, unspecified    Asthma    Obesity     Patient Active Problem List   Diagnosis Date Noted   Anemia, unspecified    AMENORRHEA 05/27/2010   FATIGUE 05/27/2010   CELLULITIS 12/28/2009   DYSMENORRHEA 03/12/2009   MENORRHAGIA 03/12/2009   HIDRADENITIS SUPPURATIVA 03/12/2009   OBESITY 05/31/2008    Past Surgical History:  Procedure Laterality Date   CHOLECYSTECTOMY     right auxillary dissection for hydradenitis      OB History   No obstetric history on file.      Home Medications    Prior to Admission medications   Medication Sig Start Date End Date Taking? Authorizing Provider  cyclobenzaprine (FLEXERIL) 5 MG tablet Take 1 tablet (5 mg total) by mouth 3 (three) times daily as needed for muscle spasms. Do not drink alcohol or drive while taking this medication.  May cause drowsiness. 01/24/24  Yes Particia Nearing, PA-C  ALPRAZolam Prudy Feeler) 0.25 MG tablet Take 0.25 mg by mouth 3 (three) times daily as needed for anxiety. 08/10/19   [provider]  atorvastatin (LIPITOR) 20 MG tablet Take 1 tablet (20 mg total) by mouth daily for high cholesterol. 10/25/23     atorvastatin (LIPITOR) 20 MG tablet Take 1 tablet (20 mg total) by mouth daily for high cholesterol. 10/26/23     azelastine  (ASTELIN) 0.1 % nasal spray Place 2 sprays into both nostrils 2 (two) times daily. 01/25/24   Birder Robson, MD  brompheniramine-pseudoephedrine-DM 30-2-10 MG/5ML syrup Take 5 mLs by mouth 4 (four) times daily as needed. 05/09/23   Leath-Warren, Sadie Haber, NP  cyclobenzaprine (FLEXERIL) 10 MG tablet Take 10 mg by mouth 3 (three) times daily as needed for muscle spasms. 40mg  2x a day 08/12/19   [provider]  cyclobenzaprine (FLEXERIL) 10 MG tablet Take 1 tablet (10 mg total) by mouth daily. 08/05/23     EPINEPHrine 0.3 mg/0.3 mL IJ SOAJ injection Inject 0.3 mg into the muscle as needed for anaphylaxis. 01/25/24   Birder Robson, MD  fexofenadine (ALLEGRA ALLERGY CHILDRENS) 30 MG/5ML suspension Take 5 mLs (30 mg total) by mouth daily. 05/13/23   Birder Robson, MD  fluticasone (FLONASE) 50 MCG/ACT nasal spray Place 2 sprays into both nostrils daily. 01/25/24   Birder Robson, MD  fluticasone (FLOVENT HFA) 110 MCG/ACT inhaler With respiratory illness or flare ups, start 2 puffs twice daily for 1-2 weeks. 01/25/24   Birder Robson, MD  furosemide (LASIX) 20 MG tablet Take 1 tablet (20 mg total) by mouth daily as needed. 02/20/23   Raspet, Noberto Retort, PA-C  HYDROcodone-acetaminophen (NORCO/VICODIN) 5-325 MG tablet Take 1 tablet by mouth as needed. 11/18/22   [provider]  hydrOXYzine (VISTARIL) 25 MG capsule as needed. 12/28/19   [provider]  ibuprofen (ADVIL) 800 MG tablet Take 1 tablet (800 mg total) by mouth 3 (three) times daily. 11/03/22   Leath-Warren, Sadie Haber, NP  ipratropium (ATROVENT) 0.03 % nasal spray SMARTSIG:1-2 Spray(s) Both Nares 1-3 Times Daily PRN    [provider]  levalbuterol (XOPENEX HFA) 45 MCG/ACT inhaler Inhale 1 puff into the lungs every 6 hours as needed for wheezing or shortness of breath. 01/25/24   Birder Robson, MD  levocetirizine (XYZAL) 5 MG tablet Take 1 tablet (5 mg total) by mouth every evening. 01/25/24   Birder Robson, MD  montelukast  (SINGULAIR) 10 MG tablet Take 1 tablet (10 mg total) by mouth at bedtime. 01/25/24   Birder Robson, MD  omeprazole (PRILOSEC) 40 MG capsule Take 1 capsule (40 mg total) by mouth 2 (two) times daily 30 minutes before morning and evening meals 08/28/22     ondansetron (ZOFRAN) 4 MG tablet Take 1 tablet (4 mg total) by mouth every 8 (eight) hours as needed for nausea or vomiting. 11/03/22   Leath-Warren, Sadie Haber, NP  promethazine (PHENERGAN) 25 MG tablet Take 25 mg by mouth every 6 (six) hours as needed. 11/05/22   [provider]    Family History Family History  Problem Relation Age of Onset   Asthma Mother    Hypertension Mother        [employer]    GER disease Mother    Asthma Brother     Social History Social History   Tobacco Use   Smoking status: Never    Passive exposure: Never   Smokeless tobacco: Never  Vaping Use   Vaping status: Never Used  Substance Use Topics   Alcohol use: Not Currently   Drug use: Never     Allergies   Bactrim [sulfamethoxazole-trimethoprim] and Morphine and codeine   Review of Systems Review of Systems Per HPI  Physical Exam Triage Vital Signs ED Triage Vitals  Encounter Vitals Group     BP 01/24/24 1425 132/83     Systolic BP Percentile --      Diastolic BP Percentile --      Pulse Rate 01/24/24 1425 99     Resp 01/24/24 1425 20     Temp 01/24/24 1425 98.3 F (36.8 C)     Temp Source 01/24/24 1425 Oral     SpO2 01/24/24 1425 97 %     Weight --      Height --      Head Circumference --      Peak Flow --      Pain Score 01/24/24 1427 8     Pain Loc --      Pain Education --      Exclude from Growth Chart --    No data found.  Updated Vital Signs BP 132/83 (BP Location: Right Arm)   Pulse 99   Temp 98.3 F (36.8 C) (Oral)   Resp 20   SpO2 97%   Visual Acuity Right Eye Distance:   Left Eye Distance:   Bilateral Distance:    Right Eye Near:   Left Eye Near:    Bilateral Near:     Physical Exam Vitals and  nursing note reviewed.  Constitutional:      Appearance: Normal appearance. She is not ill-appearing.  HENT:     Head: Atraumatic.  Eyes:     Extraocular Movements: Extraocular movements intact.  Conjunctiva/sclera: Conjunctivae normal.  Cardiovascular:     Rate and Rhythm: Normal rate.  Pulmonary:     Effort: Pulmonary effort is normal.  Musculoskeletal:        General: Tenderness present. No signs of injury. Normal range of motion.     Cervical back: Normal range of motion and neck supple.     Comments: Right plantar foot tenderness to palpation extending from midfoot to heel.  No bony deformities palpable, range of motion appears intact  Skin:    General: Skin is warm and dry.     Findings: No bruising, erythema or rash.  Neurological:     Mental Status: She is alert and oriented to person, place, and time.     Comments: Right lower extremity neurovascularly intact  Psychiatric:        Mood and Affect: Mood normal.        Thought Content: Thought content normal.        Judgment: Judgment normal.      UC Treatments / Results  Labs (all labs ordered are listed, but only abnormal results are displayed) Labs Reviewed - No data to display  EKG   Radiology No results found.  Procedures Procedures (including critical care time)  Medications Ordered in UC Medications  dexamethasone (DECADRON) injection 10 mg (10 mg Intramuscular Given 01/24/24 1530)    Initial Impression / Assessment and Plan / UC Course  I have reviewed the triage vital signs and the nursing notes.  Pertinent labs & imaging results that were available during my care of the patient were reviewed by me and considered in my medical decision making (see chart for details).     Suspect plantar fasciitis.  Treat with IM Decadron, Flexeril, stretches, massage, heat.  Return for worsening symptoms.  Final Clinical Impressions(s) / UC Diagnoses   Final diagnoses:  Plantar fasciitis   Discharge  Instructions   None    ED Prescriptions     Medication Sig Dispense Auth. Provider   cyclobenzaprine (FLEXERIL) 5 MG tablet Take 1 tablet (5 mg total) by mouth 3 (three) times daily as needed for muscle spasms. Do not drink alcohol or drive while taking this medication.  May cause drowsiness. 15 tablet Particia Nearing, [place]      PDMP not reviewed this encounter.   Particia Nearing, [place] 01/28/24 [phone]

## 2024-02-03 ENCOUNTER — Ambulatory Visit (INDEPENDENT_AMBULATORY_CARE_PROVIDER_SITE_OTHER): Payer: Self-pay

## 2024-02-03 DIAGNOSIS — J309 Allergic rhinitis, unspecified: Secondary | ICD-10-CM

## 2024-02-10 ENCOUNTER — Ambulatory Visit (INDEPENDENT_AMBULATORY_CARE_PROVIDER_SITE_OTHER): Payer: Self-pay

## 2024-02-10 DIAGNOSIS — J309 Allergic rhinitis, unspecified: Secondary | ICD-10-CM | POA: Diagnosis not present

## 2024-02-16 ENCOUNTER — Emergency Department (HOSPITAL_COMMUNITY)
Admission: EM | Admit: 2024-02-16 | Discharge: 2024-02-16 | Disposition: A | Discharge status: Home / Self Care | Attending: Emergency Medicine | Admitting: Emergency Medicine

## 2024-02-16 ENCOUNTER — Encounter (HOSPITAL_COMMUNITY): Payer: Self-pay

## 2024-02-16 ENCOUNTER — Other Ambulatory Visit: Payer: Self-pay

## 2024-02-16 DIAGNOSIS — U071 COVID-19: Secondary | ICD-10-CM | POA: Diagnosis not present

## 2024-02-16 DIAGNOSIS — R059 Cough, unspecified: Secondary | ICD-10-CM | POA: Diagnosis present

## 2024-02-16 DIAGNOSIS — J45909 Unspecified asthma, uncomplicated: Secondary | ICD-10-CM | POA: Diagnosis not present

## 2024-02-16 DIAGNOSIS — J069 Acute upper respiratory infection, unspecified: Secondary | ICD-10-CM

## 2024-02-16 LAB — RESP PANEL BY RT-PCR (RSV, FLU A&B, COVID)  RVPGX2
Influenza A by PCR: NEGATIVE
Influenza B by PCR: NEGATIVE
Resp Syncytial Virus by PCR: NEGATIVE
SARS Coronavirus 2 by RT PCR: POSITIVE — AB

## 2024-02-16 MED ORDER — DEXAMETHASONE SODIUM PHOSPHATE 10 MG/ML IJ SOLN
10.0000 mg | Freq: Once | INTRAMUSCULAR | Status: AC
  Administered 2024-02-16: 10 mg via INTRAMUSCULAR
  Filled 2024-02-16: qty 1

## 2024-02-16 MED ORDER — ONDANSETRON HCL 4 MG PO TABS: 4.0000 mg | ORAL_TABLET | Freq: Three times a day (TID) | ORAL | 0 refills | Status: AC | PRN

## 2024-02-16 MED ORDER — BENZONATATE 100 MG PO CAPS: 100.0000 mg | ORAL_CAPSULE | Freq: Three times a day (TID) | ORAL | 0 refills | Status: AC | PRN

## 2024-02-16 MED ORDER — ACETAMINOPHEN 500 MG PO TABS
1000.0000 mg | ORAL_TABLET | Freq: Once | ORAL | Status: AC
  Administered 2024-02-16: 1000 mg via ORAL
  Filled 2024-02-16: qty 2

## 2024-02-16 MED ORDER — KETOROLAC TROMETHAMINE 15 MG/ML IJ SOLN
15.0000 mg | Freq: Once | INTRAMUSCULAR | Status: AC
  Administered 2024-02-16: 15 mg via INTRAMUSCULAR
  Filled 2024-02-16: qty 1

## 2024-02-16 NOTE — ED Triage Notes (Signed)
 Pt reports cough, nasal congestion and sore throat since yesterday.

## 2024-02-16 NOTE — ED Provider Notes (Signed)
 [place] EMERGENCY DEPARTMENT AT [facility] Provider Note  CSN: [id number] Arrival date & time: 02/16/24 2054  Chief Complaint(s) URI  HPI Jane Wu is a 53 y.o. female presenting to the emergency department with runny nose, sore throat, cough, malaise, subjective fevers and chills.  No productive cough.  She also has bilateral ear pain, worse on the right.  Was worried she had a URI.   Past Medical History Past Medical History:  Diagnosis Date   Anemia, unspecified    Asthma    Obesity    Patient Active Problem List   Diagnosis Date Noted   Anemia, unspecified    AMENORRHEA 05/27/2010   FATIGUE 05/27/2010   CELLULITIS 12/28/2009   DYSMENORRHEA 03/12/2009   MENORRHAGIA 03/12/2009   HIDRADENITIS SUPPURATIVA 03/12/2009   OBESITY 05/31/2008   Home Medication(s) Prior to Admission medications   Medication Sig Start Date End Date Taking? Authorizing Provider  benzonatate  (TESSALON ) 100 MG capsule Take 1 capsule (100 mg total) by mouth 3 (three) times daily as needed for cough. 02/16/24  Yes Mordecai Applebaum, MD  ALPRAZolam (XANAX) 0.25 MG tablet Take 0.25 mg by mouth 3 (three) times daily as needed for anxiety. 08/10/19   [provider]  atorvastatin  (LIPITOR) 20 MG tablet Take 1 tablet (20 mg total) by mouth daily for high cholesterol. 10/25/23     atorvastatin  (LIPITOR) 20 MG tablet Take 1 tablet (20 mg total) by mouth daily for high cholesterol. 10/26/23     azelastine  (ASTELIN ) 0.1 % nasal spray Place 2 sprays into both nostrils 2 (two) times daily. 01/25/24   Kandice Orleans, MD  brompheniramine-pseudoephedrine-DM 30-2-10 MG/5ML syrup Take 5 mLs by mouth 4 (four) times daily as needed. 05/09/23   Leath-Warren, Belen Bowers, NP  cyclobenzaprine  (FLEXERIL ) 10 MG tablet Take 10 mg by mouth 3 (three) times daily as needed for muscle spasms. 40mg  2x a day 08/12/19   [provider]  cyclobenzaprine  (FLEXERIL ) 10 MG tablet Take 1 tablet (10 mg total)  by mouth daily. 08/05/23     cyclobenzaprine  (FLEXERIL ) 5 MG tablet Take 1 tablet (5 mg total) by mouth 3 (three) times daily as needed for muscle spasms. Do not drink alcohol or drive while taking this medication.  May cause drowsiness. 01/24/24   Corbin Dess, PA-C  EPINEPHrine  0.3 mg/0.3 mL IJ SOAJ injection Inject 0.3 mg into the muscle as needed for anaphylaxis. 01/25/24   Kandice Orleans, MD  fexofenadine  (ALLEGRA  ALLERGY  CHILDRENS) 30 MG/5ML suspension Take 5 mLs (30 mg total) by mouth daily. 05/13/23   Kandice Orleans, MD  fluticasone  (FLONASE ) 50 MCG/ACT nasal spray Place 2 sprays into both nostrils daily. 01/25/24   Kandice Orleans, MD  fluticasone  (FLOVENT  HFA) 110 MCG/ACT inhaler With respiratory illness or flare ups, start 2 puffs twice daily for 1-2 weeks. 01/25/24   Kandice Orleans, MD  furosemide  (LASIX ) 20 MG tablet Take 1 tablet (20 mg total) by mouth daily as needed. 02/20/23   Raspet, Erin K, PA-C  HYDROcodone-acetaminophen (NORCO/VICODIN) 5-325 MG tablet Take 1 tablet by mouth as needed. 11/18/22   [provider]  hydrOXYzine (VISTARIL) 25 MG capsule as needed. 12/28/19   [provider]  ibuprofen  (ADVIL ) 800 MG tablet Take 1 tablet (800 mg total) by mouth 3 (three) times daily. 11/03/22   Leath-Warren, Belen Bowers, NP  ipratropium (ATROVENT) 0.03 % nasal spray SMARTSIG:1-2 Spray(s) Both Nares 1-3 Times Daily PRN    [provider]  levalbuterol  (XOPENEX  HFA) 45 MCG/ACT inhaler Inhale 1 puff into the lungs every 6 hours as needed for wheezing or shortness of breath. 01/25/24   Kandice Orleans, MD  levocetirizine (XYZAL ) 5 MG tablet Take 1 tablet (5 mg total) by mouth every evening. 01/25/24   Kandice Orleans, MD  montelukast  (SINGULAIR ) 10 MG tablet Take 1 tablet (10 mg total) by mouth at bedtime. 01/25/24   Kandice Orleans, MD  omeprazole  (PRILOSEC) 40 MG capsule Take 1 capsule (40 mg total) by mouth 2 (two) times daily 30 minutes before morning and evening meals  08/28/22     ondansetron  (ZOFRAN ) 4 MG tablet Take 1 tablet (4 mg total) by mouth every 8 (eight) hours as needed for nausea or vomiting. 02/16/24   Mordecai Applebaum, MD  promethazine (PHENERGAN) 25 MG tablet Take 25 mg by mouth every 6 (six) hours as needed. 11/05/22   [provider]                                                                                                                                    Past Surgical History Past Surgical History:  Procedure Laterality Date   CHOLECYSTECTOMY     right auxillary dissection for hydradenitis     Family History Family History  Problem Relation Age of Onset   Asthma Mother    Hypertension Mother        [place]    GER disease Mother    Asthma Brother     Social History Social History   Tobacco Use   Smoking status: Never    Passive exposure: Never   Smokeless tobacco: Never  Vaping Use   Vaping status: Never Used  Substance Use Topics   Alcohol use: Not Currently   Drug use: Never   Allergies Bactrim [sulfamethoxazole-trimethoprim] and Morphine and codeine  Review of Systems Review of Systems  All other systems reviewed and are negative.   Physical Exam Vital Signs  I have reviewed the triage vital signs BP (!) 141/88   Pulse (!) 105   Temp 100.2 F (37.9 C) (Oral)   Resp 20   Ht 5\' 4"  (1.626 m)   Wt 91.2 kg   SpO2 100%   BMI 34.50 kg/m  Physical Exam Vitals and nursing note reviewed.  Constitutional:      General: She is not in acute distress.    Appearance: She is well-developed.  HENT:     Head: Normocephalic and atraumatic.     Right Ear: Tympanic membrane normal.     Left Ear: Tympanic membrane normal.     Mouth/Throat:     Mouth: Mucous membranes are moist.  Eyes:     Pupils: Pupils are equal, round, and reactive to light.  Cardiovascular:     Rate and Rhythm: Normal rate and regular rhythm.     Heart sounds: No murmur heard. Pulmonary:  Effort: Pulmonary effort is normal.  No respiratory distress.     Breath sounds: Normal breath sounds.  Abdominal:     General: Abdomen is flat.     Palpations: Abdomen is soft.     Tenderness: There is no abdominal tenderness.  Musculoskeletal:        General: No tenderness.     Right lower leg: No edema.     Left lower leg: No edema.  Skin:    General: Skin is warm and dry.  Neurological:     General: No focal deficit present.     Mental Status: She is alert. Mental status is at baseline.  Psychiatric:        Mood and Affect: Mood normal.        Behavior: Behavior normal.     ED Results and Treatments Labs (all labs ordered are listed, but only abnormal results are displayed) Labs Reviewed  RESP PANEL BY RT-PCR (RSV, FLU A&B, COVID)  RVPGX2 - Abnormal; Notable for the following components:      Result Value   SARS Coronavirus 2 by RT PCR POSITIVE (*)    All other components within normal limits                                                                                                                          Radiology No results found.  Pertinent labs & imaging results that were available during my care of the patient were reviewed by me and considered in my medical decision making (see MDM for details).  Medications Ordered in ED Medications  acetaminophen (TYLENOL) tablet 1,000 mg (has no administration in time range)  ketorolac  (TORADOL ) 15 MG/ML injection 15 mg (has no administration in time range)  dexamethasone  (DECADRON ) injection 10 mg (has no administration in time range)                                                                                                                                     Procedures Procedures  (including critical care time)  Medical Decision Making / ED Course   MDM:  53 year old presenting to the emergency department with URI type symptoms.  Patient's COVID test is positive.  Will provide symptomatic treatment. Low concern for any underlying bacterial  illness. Gave dose of Decadron  for her sore throat as well as Toradol  for her body aches and fever.  Prescribed  Zofran  for her nausea and Tessalon  for her cough.  Patient is feeling better. Will discharge patient to home. All questions answered. Patient comfortable with plan of discharge. Return precautions discussed with patient and specified on the after visit summary.        Lab Tests: -I ordered, reviewed, and interpreted labs.   The pertinent results include:   Labs Reviewed  RESP PANEL BY RT-PCR (RSV, FLU A&B, COVID)  RVPGX2 - Abnormal; Notable for the following components:      Result Value   SARS Coronavirus 2 by RT PCR POSITIVE (*)    All other components within normal limits    Notable for + COVID     Medicines ordered and prescription drug management: Meds ordered this encounter  Medications   acetaminophen (TYLENOL) tablet 1,000 mg   ketorolac  (TORADOL ) 15 MG/ML injection 15 mg   dexamethasone  (DECADRON ) injection 10 mg   benzonatate  (TESSALON ) 100 MG capsule    Sig: Take 1 capsule (100 mg total) by mouth 3 (three) times daily as needed for cough.    Dispense:  21 capsule    Refill:  0   ondansetron  (ZOFRAN ) 4 MG tablet    Sig: Take 1 tablet (4 mg total) by mouth every 8 (eight) hours as needed for nausea or vomiting.    Dispense:  10 tablet    Refill:  0    -I have reviewed the patients home medicines and have made adjustments as needed  Social Determinants of Health:  Diagnosis or treatment significantly limited by social determinants of health: obesity   Reevaluation: After the interventions noted above, I reevaluated the patient and found that their symptoms have improved  Co morbidities that complicate the patient evaluation  Past Medical History:  Diagnosis Date   Anemia, unspecified    Asthma    Obesity       Dispostion: Disposition decision including need for hospitalization was considered, and patient discharged from emergency  department.    Final Clinical Impression(s) / ED Diagnoses Final diagnoses:  Viral URI with cough     This chart was dictated using voice recognition software.  Despite best efforts to proofread,  errors can occur which can change the documentation meaning.    Mordecai Applebaum, MD 02/16/24 2239

## 2024-02-16 NOTE — ED Notes (Signed)
 Patient discharged. Provider spoke to patient. Paperwork given to patient and reviewed. Pt verbalized understanding. VSS. A+Ox4. Patient ambulated out of the ER with steady, independent gait. No iv in place.

## 2024-02-16 NOTE — Discharge Instructions (Addendum)
 We evaluated you for your cough and congestion.  Your examination was reassuring.  We do not think you have a pneumonia or a bacterial infection.  Your COVID test was positive.  Please take Tylenol (acetaminophen) and Motrin  (ibuprofen ) for your symptoms at home.  You can take 1000 mg of Tylenol every 6 hours and 600 mg of Motrin  every 6 hours as needed for your symptoms.  You can take these medicines together as needed, either at the same time, or alternating every 3 hours.  We have also prescribed you cough medication and nausea medication.  Please return if you develop any new or worsening symptoms such as difficulty breathing, chest pain, worsening cough, persistent symptoms, or any other concerning symptoms.

## 2024-02-22 ENCOUNTER — Ambulatory Visit: Admitting: Internal Medicine

## 2024-02-24 ENCOUNTER — Other Ambulatory Visit (HOSPITAL_COMMUNITY): Payer: Self-pay | Admitting: Obstetrics and Gynecology

## 2024-02-24 DIAGNOSIS — Z1231 Encounter for screening mammogram for malignant neoplasm of breast: Secondary | ICD-10-CM

## 2024-03-02 ENCOUNTER — Ambulatory Visit (INDEPENDENT_AMBULATORY_CARE_PROVIDER_SITE_OTHER): Payer: Self-pay

## 2024-03-02 DIAGNOSIS — J309 Allergic rhinitis, unspecified: Secondary | ICD-10-CM

## 2024-03-09 ENCOUNTER — Ambulatory Visit (INDEPENDENT_AMBULATORY_CARE_PROVIDER_SITE_OTHER)

## 2024-03-09 DIAGNOSIS — J309 Allergic rhinitis, unspecified: Secondary | ICD-10-CM

## 2024-03-11 ENCOUNTER — Ambulatory Visit (HOSPITAL_COMMUNITY)

## 2024-03-11 ENCOUNTER — Inpatient Hospital Stay: Payer: Self-pay

## 2024-03-16 ENCOUNTER — Ambulatory Visit (INDEPENDENT_AMBULATORY_CARE_PROVIDER_SITE_OTHER): Payer: Self-pay

## 2024-03-16 DIAGNOSIS — J309 Allergic rhinitis, unspecified: Secondary | ICD-10-CM

## 2024-03-25 ENCOUNTER — Ambulatory Visit (INDEPENDENT_AMBULATORY_CARE_PROVIDER_SITE_OTHER): Payer: Self-pay

## 2024-03-25 DIAGNOSIS — J309 Allergic rhinitis, unspecified: Secondary | ICD-10-CM | POA: Diagnosis not present

## 2024-04-01 ENCOUNTER — Ambulatory Visit (INDEPENDENT_AMBULATORY_CARE_PROVIDER_SITE_OTHER): Payer: Self-pay

## 2024-04-01 DIAGNOSIS — J309 Allergic rhinitis, unspecified: Secondary | ICD-10-CM

## 2024-04-06 ENCOUNTER — Ambulatory Visit (INDEPENDENT_AMBULATORY_CARE_PROVIDER_SITE_OTHER): Payer: Self-pay

## 2024-04-06 DIAGNOSIS — J309 Allergic rhinitis, unspecified: Secondary | ICD-10-CM

## 2024-04-13 ENCOUNTER — Encounter: Payer: Self-pay | Admitting: Allergy & Immunology

## 2024-04-13 ENCOUNTER — Ambulatory Visit (INDEPENDENT_AMBULATORY_CARE_PROVIDER_SITE_OTHER): Payer: Self-pay

## 2024-04-13 DIAGNOSIS — J309 Allergic rhinitis, unspecified: Secondary | ICD-10-CM

## 2024-04-13 MED ORDER — EPINEPHRINE 0.3 MG/0.3ML IJ SOAJ: 0.3000 mg | INTRAMUSCULAR | 1 refills | Status: DC | PRN

## 2024-04-20 ENCOUNTER — Ambulatory Visit (INDEPENDENT_AMBULATORY_CARE_PROVIDER_SITE_OTHER): Payer: Self-pay

## 2024-04-20 ENCOUNTER — Encounter: Payer: Self-pay | Admitting: Allergy & Immunology

## 2024-04-20 DIAGNOSIS — J309 Allergic rhinitis, unspecified: Secondary | ICD-10-CM

## 2024-04-29 ENCOUNTER — Encounter: Payer: Self-pay | Admitting: Allergy & Immunology

## 2024-04-29 ENCOUNTER — Ambulatory Visit (INDEPENDENT_AMBULATORY_CARE_PROVIDER_SITE_OTHER)

## 2024-04-29 ENCOUNTER — Other Ambulatory Visit (HOSPITAL_COMMUNITY): Payer: Self-pay

## 2024-04-29 DIAGNOSIS — J309 Allergic rhinitis, unspecified: Secondary | ICD-10-CM | POA: Diagnosis not present

## 2024-05-06 ENCOUNTER — Ambulatory Visit (INDEPENDENT_AMBULATORY_CARE_PROVIDER_SITE_OTHER): Payer: Self-pay

## 2024-05-06 DIAGNOSIS — J309 Allergic rhinitis, unspecified: Secondary | ICD-10-CM

## 2024-05-11 ENCOUNTER — Ambulatory Visit (INDEPENDENT_AMBULATORY_CARE_PROVIDER_SITE_OTHER): Payer: Self-pay

## 2024-05-11 DIAGNOSIS — J309 Allergic rhinitis, unspecified: Secondary | ICD-10-CM | POA: Diagnosis not present

## 2024-05-12 ENCOUNTER — Other Ambulatory Visit (HOSPITAL_BASED_OUTPATIENT_CLINIC_OR_DEPARTMENT_OTHER): Payer: Self-pay

## 2024-05-18 ENCOUNTER — Ambulatory Visit (INDEPENDENT_AMBULATORY_CARE_PROVIDER_SITE_OTHER)

## 2024-05-18 DIAGNOSIS — J309 Allergic rhinitis, unspecified: Secondary | ICD-10-CM

## 2024-05-25 ENCOUNTER — Ambulatory Visit (INDEPENDENT_AMBULATORY_CARE_PROVIDER_SITE_OTHER): Payer: Self-pay

## 2024-05-25 DIAGNOSIS — J309 Allergic rhinitis, unspecified: Secondary | ICD-10-CM | POA: Diagnosis not present

## 2024-06-01 ENCOUNTER — Ambulatory Visit (INDEPENDENT_AMBULATORY_CARE_PROVIDER_SITE_OTHER): Payer: Self-pay

## 2024-06-01 DIAGNOSIS — J309 Allergic rhinitis, unspecified: Secondary | ICD-10-CM

## 2024-06-09 ENCOUNTER — Other Ambulatory Visit (HOSPITAL_BASED_OUTPATIENT_CLINIC_OR_DEPARTMENT_OTHER): Payer: Self-pay

## 2024-06-10 ENCOUNTER — Ambulatory Visit (INDEPENDENT_AMBULATORY_CARE_PROVIDER_SITE_OTHER)

## 2024-06-10 DIAGNOSIS — J309 Allergic rhinitis, unspecified: Secondary | ICD-10-CM

## 2024-06-14 ENCOUNTER — Other Ambulatory Visit (HOSPITAL_COMMUNITY): Payer: Self-pay

## 2024-06-14 MED ORDER — ATORVASTATIN CALCIUM 20 MG PO TABS
20.0000 mg | ORAL_TABLET | Freq: Every day | ORAL | 2 refills | Status: AC
  Filled 2024-06-14 – 2024-11-23 (×2): qty 30, 30d supply, fill #0

## 2024-06-14 MED ORDER — LEVOCETIRIZINE DIHYDROCHLORIDE 5 MG PO TABS
5.0000 mg | ORAL_TABLET | Freq: Every day | ORAL | 11 refills | Status: DC
  Filled 2024-06-14: qty 30, 30d supply, fill #0

## 2024-06-14 MED ORDER — OMEPRAZOLE 40 MG PO CPDR
40.0000 mg | DELAYED_RELEASE_CAPSULE | Freq: Every day | ORAL | 1 refills | Status: AC
  Filled 2024-06-14 – 2024-07-25 (×2): qty 30, 30d supply, fill #0

## 2024-06-15 ENCOUNTER — Ambulatory Visit (INDEPENDENT_AMBULATORY_CARE_PROVIDER_SITE_OTHER): Payer: Self-pay

## 2024-06-15 DIAGNOSIS — J309 Allergic rhinitis, unspecified: Secondary | ICD-10-CM

## 2024-06-21 ENCOUNTER — Other Ambulatory Visit (HOSPITAL_COMMUNITY): Payer: Self-pay

## 2024-06-21 ENCOUNTER — Other Ambulatory Visit: Payer: Self-pay

## 2024-06-21 MED ORDER — CYCLOBENZAPRINE HCL 10 MG PO TABS
10.0000 mg | ORAL_TABLET | Freq: Every evening | ORAL | 0 refills | Status: AC | PRN
  Filled 2024-06-21 – 2024-09-28 (×2): qty 30, 30d supply, fill #0

## 2024-06-21 MED ORDER — ATORVASTATIN CALCIUM 20 MG PO TABS
20.0000 mg | ORAL_TABLET | Freq: Every day | ORAL | 5 refills | Status: AC
  Filled 2024-06-21 – 2024-07-20 (×2): qty 30, 30d supply, fill #0
  Filled 2024-11-23: qty 30, 30d supply, fill #1

## 2024-06-21 MED ORDER — ONDANSETRON HCL 4 MG PO TABS
4.0000 mg | ORAL_TABLET | Freq: Every day | ORAL | 0 refills | Status: AC
  Filled 2024-06-21: qty 30, 30d supply, fill #0

## 2024-06-21 MED ORDER — OMEPRAZOLE 40 MG PO CPDR
40.0000 mg | DELAYED_RELEASE_CAPSULE | Freq: Two times a day (BID) | ORAL | 5 refills | Status: AC
  Filled 2024-06-21: qty 30, 15d supply, fill #0

## 2024-06-21 MED ORDER — LEVOCETIRIZINE DIHYDROCHLORIDE 5 MG PO TABS
5.0000 mg | ORAL_TABLET | Freq: Every day | ORAL | 5 refills | Status: DC
  Filled 2024-06-21: qty 30, 30d supply, fill #0

## 2024-06-23 ENCOUNTER — Other Ambulatory Visit (HOSPITAL_COMMUNITY): Payer: Self-pay

## 2024-06-24 ENCOUNTER — Ambulatory Visit (INDEPENDENT_AMBULATORY_CARE_PROVIDER_SITE_OTHER): Payer: Self-pay

## 2024-06-24 DIAGNOSIS — J309 Allergic rhinitis, unspecified: Secondary | ICD-10-CM | POA: Diagnosis not present

## 2024-06-28 ENCOUNTER — Encounter: Payer: Self-pay | Admitting: Nutrition

## 2024-06-28 ENCOUNTER — Encounter: Attending: Internal Medicine | Admitting: Nutrition

## 2024-06-28 DIAGNOSIS — E6609 Other obesity due to excess calories: Secondary | ICD-10-CM | POA: Insufficient documentation

## 2024-06-28 DIAGNOSIS — R7303 Prediabetes: Secondary | ICD-10-CM | POA: Insufficient documentation

## 2024-06-28 DIAGNOSIS — Z6834 Body mass index (BMI) 34.0-34.9, adult: Secondary | ICD-10-CM | POA: Diagnosis present

## 2024-06-28 DIAGNOSIS — E66811 Obesity, class 1: Secondary | ICD-10-CM | POA: Insufficient documentation

## 2024-06-28 NOTE — Patient Instructions (Signed)
 Goals  Eat three meals per day at times discussed B) 8 am Lunch 12-2 D) 5-7 Focus on whole plant based foods-- fruits, vegetables and whole grains. Drink only water and cut out sodas Walk 30 minutes 5 days per week Just out junk food Lose 1 lb per week Get A1C to 5.7%  or less in 3 months

## 2024-06-28 NOTE — Progress Notes (Signed)
 Medical Nutrition Therapy  Appointment Start time:  [phone]  Appointment End time:  1040  Primary concerns today: Pre DM  Referral diagnosis: E73.03 Preferred learning style: No Preference  Learning readiness: Ready    NUTRITION ASSESSMENT  53 yr old bfemale referred for pre diabetes. A1C 6%.  PCP Elsa Bucio. Admits to eating processed foods and junk food. Wants to lose weight, reverse pre diabetes. She is willing to work on diet and lifestyle.  Clinical Medical Hx:  Wt Readings from Last 3 Encounters:  02/16/24 201 lb (91.2 kg)  09/21/23 201 lb 12.8 oz (91.5 kg)  06/19/23 201 lb 12.8 oz (91.5 kg)   Ht Readings from Last 3 Encounters:  02/16/24 5' 4 (1.626 m)  05/13/23 5' 4 (1.626 m)  03/13/23 5' 4 (1.626 m)   There is no height or weight on file to calculate BMI. @BMIFA @ Facility age limit for growth %iles is 20 years. Facility age limit for growth %iles is 20 years.  Past Medical History:  Diagnosis Date   Anemia, unspecified    Asthma    Obesity     Medications:  Current Outpatient Medications on File Prior to Visit  Medication Sig Dispense Refill   ALPRAZolam (XANAX) 0.25 MG tablet Take 0.25 mg by mouth 3 (three) times daily as needed for anxiety.     atorvastatin  (LIPITOR) 20 MG tablet Take 1 tablet (20 mg total) by mouth daily for high cholesterol. 30 tablet 1   atorvastatin  (LIPITOR) 20 MG tablet Take 1 tablet (20 mg total) by mouth daily for high cholesterol. 30 tablet 1   atorvastatin  (LIPITOR) 20 MG tablet Take 1 tablet (20 mg total) by mouth daily. 30 tablet 2   atorvastatin  (LIPITOR) 20 MG tablet Take 1 tablet (20 mg total) by mouth daily. 30 tablet 5   azelastine  (ASTELIN ) 0.1 % nasal spray Place 2 sprays into both nostrils 2 (two) times daily. 30 mL 5   benzonatate  (TESSALON ) 100 MG capsule Take 1 capsule (100 mg total) by mouth 3 (three) times daily as needed for cough. 21 capsule 0   brompheniramine-pseudoephedrine-DM 30-2-10 MG/5ML syrup Take 5 mLs by  mouth 4 (four) times daily as needed. 140 mL 0   cyclobenzaprine  (FLEXERIL ) 10 MG tablet Take 10 mg by mouth 3 (three) times daily as needed for muscle spasms. 40mg  2x a day     cyclobenzaprine  (FLEXERIL ) 10 MG tablet Take 1 tablet (10 mg total) by mouth daily as needed for muscle spasms. 30 tablet 5   cyclobenzaprine  (FLEXERIL ) 10 MG tablet Take 1 tablet (10 mg total) by mouth at bedtime as needed. 30 tablet 0   cyclobenzaprine  (FLEXERIL ) 5 MG tablet Take 1 tablet (5 mg total) by mouth 3 (three) times daily as needed for muscle spasms. Do not drink alcohol or drive while taking this medication.  May cause drowsiness. 15 tablet 0   EPINEPHrine  0.3 mg/0.3 mL IJ SOAJ injection Inject 0.3 mg into the muscle as needed for anaphylaxis. 2 each 1   fexofenadine  (ALLEGRA  ALLERGY  CHILDRENS) 30 MG/5ML suspension Take 5 mLs (30 mg total) by mouth daily. 240 mL 5   fluticasone  (FLONASE ) 50 MCG/ACT nasal spray Place 2 sprays into both nostrils daily. 16 g 5   fluticasone  (FLOVENT  HFA) 110 MCG/ACT inhaler With respiratory illness or flare ups, start 2 puffs twice daily for 1-2 weeks. 12 g 1   furosemide  (LASIX ) 20 MG tablet Take 1 tablet (20 mg total) by mouth daily as needed. 14 tablet 0  HYDROcodone-acetaminophen  (NORCO/VICODIN) 5-325 MG tablet Take 1 tablet by mouth as needed.     hydrOXYzine (VISTARIL) 25 MG capsule as needed.     ibuprofen  (ADVIL ) 800 MG tablet Take 1 tablet (800 mg total) by mouth 3 (three) times daily. 21 tablet 0   ipratropium (ATROVENT) 0.03 % nasal spray SMARTSIG:1-2 Spray(s) Both Nares 1-3 Times Daily PRN     levalbuterol  (XOPENEX  HFA) 45 MCG/ACT inhaler Inhale 1 puff into the lungs every 6 hours as needed for wheezing or shortness of breath. 15 g 0   levocetirizine (XYZAL  ALLERGY  24HR) 5 MG tablet Take 1 tablet (5 mg total) by mouth daily. 30 tablet 11   levocetirizine (XYZAL  ALLERGY  24HR) 5 MG tablet Take 1 tablet (5 mg total) by mouth daily. 30 tablet 5   levocetirizine (XYZAL ) 5  MG tablet Take 1 tablet (5 mg total) by mouth every evening. 30 tablet 5   montelukast  (SINGULAIR ) 10 MG tablet Take 1 tablet (10 mg total) by mouth at bedtime. 30 tablet 5   omeprazole  (PRILOSEC) 40 MG capsule Take 1 capsule (40 mg total) by mouth 2 (two) times daily 30 minutes before morning and evening meals 60 capsule 5   omeprazole  (PRILOSEC) 40 MG capsule Take 1 capsule (40 mg total) by mouth daily. 30 capsule 1   omeprazole  (PRILOSEC) 40 MG capsule Take 1 capsule (40 mg total) by mouth 2 (two) times daily. 30 capsule 5   ondansetron  (ZOFRAN ) 4 MG tablet Take 1 tablet (4 mg total) by mouth every 8 (eight) hours as needed for nausea or vomiting. 10 tablet 0   ondansetron  (ZOFRAN ) 4 MG tablet Take 1 tablet (4 mg total) by mouth daily. 30 tablet 0   promethazine (PHENERGAN) 25 MG tablet Take 25 mg by mouth every 6 (six) hours as needed.     No current facility-administered medications on file prior to visit.    Labs: A1C 6.0% Notable Signs/Symptoms: none  Lifestyle & Dietary Hx Single and lives with daughter. No working right now.  Estimated daily fluid intake: 30 oz Supplements:  Sleep: poor Stress / self-care: various stress Current average weekly physical activity: ADL  24-Hr Dietary Recall Eats 2-3 meals per day Fast foods, cooks at home some. Sodas, juices,   Estimated Energy Needs Calories: 1200 Carbohydrate: 135g Protein: 90g Fat: 33g   NUTRITION DIAGNOSIS  NB-1.1 Food and nutrition-related knowledge deficit As related to excessive calorie and carbohydrate diet.  As evidenced by BMI > 30 and A1C 6.0%.   NUTRITION INTERVENTION  Nutrition education (E-1) on the following topics:  Nutrition and Diabetes education provided on My Plate, CHO counting, meal planning, portion sizes, timing of meals, avoiding snacks between meals unless having a low blood sugar, target ranges for A1C and blood sugars, signs/symptoms and treatment of hyper/hypoglycemia, monitoring blood  sugars, taking medications as prescribed, benefits of exercising 30 minutes per day and prevention of complications of DM.  Lifestyle Medicine  - Whole Food, Plant Predominant Nutrition is highly recommended: Eat Plenty of vegetables, Mushrooms, fruits, Legumes, Whole Grains, Nuts, seeds in lieu of processed meats, processed snacks/pastries red meat, poultry, eggs.    -It is better to avoid simple carbohydrates including: Cakes, Sweet Desserts, Ice Cream, Soda (diet and regular), Sweet Tea, Candies, Chips, Cookies, Store Bought Juices, Alcohol in Excess of  1-2 drinks a day, Lemonade,  Artificial Sweeteners, Doughnuts, Coffee Creamers, Sugar-free Products, etc, etc.  This is not a complete list.....  Exercise: If you are able: 30 -60 minutes  a day ,4 days a week, or 150 minutes a week.  The longer the better.  Combine stretch, strength, and aerobic activities.  If you were told in the past that you have high risk for cardiovascular diseases, you may seek evaluation by your heart doctor prior to initiating moderate to intense exercise programs.   Handouts Provided Include  Lifestyle Medicine handouts  Learning Style & Readiness for Change Teaching method utilized: Visual & Auditory  Demonstrated degree of understanding via: Teach Back  Barriers to learning/adherence to lifestyle change: none  Goals Established by Pt Goals  Eat three meals per day at times discussed B) 8 am Lunch 12-2 D) 5-7 Focus on whole plant based foods-- fruits, vegetables and whole grains. Drink only water and cut out sodas Walk 30 minutes 5 days per week Just out junk food Lose 1 lb per week Get A1C to 5.7%  or less in 3 months   MONITORING & EVALUATION Dietary intake, weekly physical activity, and weight in 1 month.  Next Steps  Patient is to work on meal planning and meal prepping.SABRA

## 2024-07-01 ENCOUNTER — Ambulatory Visit (INDEPENDENT_AMBULATORY_CARE_PROVIDER_SITE_OTHER)

## 2024-07-01 DIAGNOSIS — J309 Allergic rhinitis, unspecified: Secondary | ICD-10-CM

## 2024-07-04 ENCOUNTER — Other Ambulatory Visit (HOSPITAL_COMMUNITY): Payer: Self-pay

## 2024-07-08 ENCOUNTER — Ambulatory Visit (INDEPENDENT_AMBULATORY_CARE_PROVIDER_SITE_OTHER): Payer: Self-pay

## 2024-07-08 DIAGNOSIS — J309 Allergic rhinitis, unspecified: Secondary | ICD-10-CM | POA: Diagnosis not present

## 2024-07-15 ENCOUNTER — Ambulatory Visit (INDEPENDENT_AMBULATORY_CARE_PROVIDER_SITE_OTHER): Payer: Self-pay

## 2024-07-15 DIAGNOSIS — J309 Allergic rhinitis, unspecified: Secondary | ICD-10-CM | POA: Diagnosis not present

## 2024-07-20 ENCOUNTER — Other Ambulatory Visit (HOSPITAL_BASED_OUTPATIENT_CLINIC_OR_DEPARTMENT_OTHER): Payer: Self-pay

## 2024-07-21 ENCOUNTER — Other Ambulatory Visit (HOSPITAL_BASED_OUTPATIENT_CLINIC_OR_DEPARTMENT_OTHER): Payer: Self-pay

## 2024-07-22 ENCOUNTER — Ambulatory Visit (INDEPENDENT_AMBULATORY_CARE_PROVIDER_SITE_OTHER)

## 2024-07-22 ENCOUNTER — Other Ambulatory Visit: Payer: Self-pay

## 2024-07-22 ENCOUNTER — Other Ambulatory Visit (HOSPITAL_BASED_OUTPATIENT_CLINIC_OR_DEPARTMENT_OTHER): Payer: Self-pay

## 2024-07-22 ENCOUNTER — Telehealth: Payer: Self-pay

## 2024-07-22 DIAGNOSIS — J309 Allergic rhinitis, unspecified: Secondary | ICD-10-CM | POA: Diagnosis not present

## 2024-07-22 MED ORDER — FLUTICASONE PROPIONATE HFA 110 MCG/ACT IN AERO
2.0000 | INHALATION_SPRAY | Freq: Two times a day (BID) | RESPIRATORY_TRACT | 0 refills | Status: DC | PRN
  Filled 2024-07-22: qty 36, 90d supply, fill #0
  Filled 2024-07-25: qty 12, 30d supply, fill #0

## 2024-07-22 MED ORDER — LEVALBUTEROL TARTRATE 45 MCG/ACT IN AERO
1.0000 | INHALATION_SPRAY | Freq: Four times a day (QID) | RESPIRATORY_TRACT | 0 refills | Status: DC | PRN
  Filled 2024-07-22 – 2024-07-27 (×2): qty 15, 25d supply, fill #0

## 2024-07-22 MED ORDER — FLUTICASONE PROPIONATE 50 MCG/ACT NA SUSP
2.0000 | Freq: Two times a day (BID) | NASAL | 0 refills | Status: DC
  Filled 2024-07-22: qty 16, 30d supply, fill #0

## 2024-07-22 MED ORDER — AZELASTINE HCL 0.1 % NA SOLN
2.0000 | Freq: Two times a day (BID) | NASAL | 5 refills | Status: DC
  Filled 2024-07-22: qty 30, 50d supply, fill #0

## 2024-07-22 MED ORDER — LEVOCETIRIZINE DIHYDROCHLORIDE 5 MG PO TABS
5.0000 mg | ORAL_TABLET | Freq: Every evening | ORAL | 1 refills | Status: DC
  Filled 2024-07-22: qty 30, 30d supply, fill #0

## 2024-07-22 MED ORDER — EPINEPHRINE 0.3 MG/0.3ML IJ SOAJ
0.3000 mg | INTRAMUSCULAR | 1 refills | Status: AC | PRN
  Filled 2024-07-22: qty 2, 1d supply, fill #0

## 2024-07-22 MED ORDER — FEXOFENADINE HCL 180 MG PO TABS
180.0000 mg | ORAL_TABLET | Freq: Every morning | ORAL | 0 refills | Status: DC
  Filled 2024-07-22: qty 90, 90d supply, fill #0
  Filled 2024-07-25: qty 30, 30d supply, fill #0

## 2024-07-22 MED ORDER — MONTELUKAST SODIUM 10 MG PO TABS
10.0000 mg | ORAL_TABLET | Freq: Every evening | ORAL | 0 refills | Status: DC
  Filled 2024-07-22: qty 90, 90d supply, fill #0

## 2024-07-22 NOTE — Telephone Encounter (Signed)
*  AA  Pharmacy Patient Advocate Encounter   Received notification from CoverMyMeds that prior authorization for Levalbuterol  Tartrate 45MCG/ACT aerosol  is required/requested.   Insurance verification completed.   The patient is insured through [facility] MEDICAID.   Per test claim:  Albuterol HFA is preferred by the insurance.  If suggested medication is appropriate, Please send in a new RX and discontinue this one. If not, please advise as to why it's not appropriate so that we may request a Prior Authorization. Please note, some preferred medications may still require a PA.  If the suggested medications have not been trialed and there are no contraindications to their use, the PA will not be submitted, as it will not be approved.   CMM Key: [id number]

## 2024-07-25 ENCOUNTER — Other Ambulatory Visit (HOSPITAL_BASED_OUTPATIENT_CLINIC_OR_DEPARTMENT_OTHER): Payer: Self-pay

## 2024-07-25 NOTE — Telephone Encounter (Signed)
 Additional information submitted to plan, pending determination.

## 2024-07-26 ENCOUNTER — Ambulatory Visit: Admitting: Nutrition

## 2024-07-27 ENCOUNTER — Other Ambulatory Visit: Payer: Self-pay | Admitting: Internal Medicine

## 2024-07-27 ENCOUNTER — Other Ambulatory Visit (HOSPITAL_BASED_OUTPATIENT_CLINIC_OR_DEPARTMENT_OTHER): Payer: Self-pay

## 2024-07-27 MED ORDER — ALBUTEROL SULFATE HFA 108 (90 BASE) MCG/ACT IN AERS
2.0000 | INHALATION_SPRAY | Freq: Four times a day (QID) | RESPIRATORY_TRACT | 1 refills | Status: DC | PRN
  Filled 2024-07-27: qty 6.7, 20d supply, fill #0

## 2024-07-27 NOTE — Progress Notes (Signed)
 PA for Xopenex  inhaler denied despite noting Albuterol causes palpitations.  Asking us  to try Nebulizers instead. Unfortunately, no way around this loophole.  Will send in regular Albuterol; if she needs Xopenex , will have to pay out pocket.

## 2024-07-29 ENCOUNTER — Ambulatory Visit (INDEPENDENT_AMBULATORY_CARE_PROVIDER_SITE_OTHER): Payer: Self-pay

## 2024-07-29 DIAGNOSIS — J309 Allergic rhinitis, unspecified: Secondary | ICD-10-CM | POA: Diagnosis not present

## 2024-08-01 ENCOUNTER — Other Ambulatory Visit (HOSPITAL_BASED_OUTPATIENT_CLINIC_OR_DEPARTMENT_OTHER): Payer: Self-pay

## 2024-08-02 DIAGNOSIS — J302 Other seasonal allergic rhinitis: Secondary | ICD-10-CM | POA: Diagnosis not present

## 2024-08-02 DIAGNOSIS — J301 Allergic rhinitis due to pollen: Secondary | ICD-10-CM | POA: Diagnosis not present

## 2024-08-02 NOTE — Progress Notes (Signed)
 VIALS MADE ON 08/02/24

## 2024-08-10 ENCOUNTER — Ambulatory Visit (INDEPENDENT_AMBULATORY_CARE_PROVIDER_SITE_OTHER): Payer: Self-pay

## 2024-08-10 DIAGNOSIS — J309 Allergic rhinitis, unspecified: Secondary | ICD-10-CM

## 2024-08-17 ENCOUNTER — Ambulatory Visit (INDEPENDENT_AMBULATORY_CARE_PROVIDER_SITE_OTHER): Payer: Self-pay

## 2024-08-17 DIAGNOSIS — J309 Allergic rhinitis, unspecified: Secondary | ICD-10-CM | POA: Diagnosis not present

## 2024-08-18 ENCOUNTER — Ambulatory Visit: Admitting: Nutrition

## 2024-08-24 ENCOUNTER — Ambulatory Visit (INDEPENDENT_AMBULATORY_CARE_PROVIDER_SITE_OTHER): Payer: Self-pay

## 2024-08-24 DIAGNOSIS — J309 Allergic rhinitis, unspecified: Secondary | ICD-10-CM | POA: Diagnosis not present

## 2024-08-31 ENCOUNTER — Ambulatory Visit (INDEPENDENT_AMBULATORY_CARE_PROVIDER_SITE_OTHER)

## 2024-08-31 DIAGNOSIS — J309 Allergic rhinitis, unspecified: Secondary | ICD-10-CM | POA: Diagnosis not present

## 2024-09-21 ENCOUNTER — Ambulatory Visit

## 2024-09-21 ENCOUNTER — Encounter: Payer: Self-pay | Admitting: Allergy & Immunology

## 2024-09-21 DIAGNOSIS — J309 Allergic rhinitis, unspecified: Secondary | ICD-10-CM

## 2024-09-26 ENCOUNTER — Ambulatory Visit: Admitting: Internal Medicine

## 2024-09-28 ENCOUNTER — Other Ambulatory Visit (HOSPITAL_BASED_OUTPATIENT_CLINIC_OR_DEPARTMENT_OTHER): Payer: Self-pay

## 2024-10-04 ENCOUNTER — Other Ambulatory Visit: Payer: Self-pay

## 2024-10-04 ENCOUNTER — Other Ambulatory Visit (HOSPITAL_BASED_OUTPATIENT_CLINIC_OR_DEPARTMENT_OTHER): Payer: Self-pay

## 2024-10-04 ENCOUNTER — Encounter: Payer: Self-pay | Admitting: Internal Medicine

## 2024-10-04 ENCOUNTER — Encounter: Admitting: Nutrition

## 2024-10-04 ENCOUNTER — Ambulatory Visit: Admitting: Internal Medicine

## 2024-10-04 VITALS — BP 118/76 | HR 88 | Temp 98.3°F | Wt 204.1 lb

## 2024-10-04 DIAGNOSIS — J3089 Other allergic rhinitis: Secondary | ICD-10-CM | POA: Diagnosis not present

## 2024-10-04 DIAGNOSIS — J329 Chronic sinusitis, unspecified: Secondary | ICD-10-CM | POA: Diagnosis not present

## 2024-10-04 DIAGNOSIS — J302 Other seasonal allergic rhinitis: Secondary | ICD-10-CM

## 2024-10-04 DIAGNOSIS — J4520 Mild intermittent asthma, uncomplicated: Secondary | ICD-10-CM | POA: Diagnosis not present

## 2024-10-04 DIAGNOSIS — R7303 Prediabetes: Secondary | ICD-10-CM | POA: Insufficient documentation

## 2024-10-04 MED ORDER — LEVOCETIRIZINE DIHYDROCHLORIDE 5 MG PO TABS
5.0000 mg | ORAL_TABLET | Freq: Every evening | ORAL | 1 refills | Status: AC
  Filled 2024-10-04: qty 90, 90d supply, fill #0
  Filled 2024-10-11: qty 30, 30d supply, fill #0

## 2024-10-04 MED ORDER — ALBUTEROL SULFATE HFA 108 (90 BASE) MCG/ACT IN AERS
2.0000 | INHALATION_SPRAY | Freq: Four times a day (QID) | RESPIRATORY_TRACT | 1 refills | Status: AC | PRN
  Filled 2024-10-04: qty 6.7, 20d supply, fill #0

## 2024-10-04 MED ORDER — FLUTICASONE PROPIONATE 50 MCG/ACT NA SUSP
2.0000 | Freq: Every day | NASAL | 5 refills | Status: AC
  Filled 2024-10-04: qty 16, 30d supply, fill #0

## 2024-10-04 MED ORDER — FLUTICASONE PROPIONATE HFA 110 MCG/ACT IN AERO
INHALATION_SPRAY | RESPIRATORY_TRACT | 1 refills | Status: AC
  Filled 2024-10-04: qty 12, 30d supply, fill #0

## 2024-10-04 MED ORDER — AZELASTINE HCL 0.1 % NA SOLN
2.0000 | Freq: Two times a day (BID) | NASAL | 5 refills | Status: AC | PRN
  Filled 2024-10-04: qty 30, 50d supply, fill #0

## 2024-10-04 MED ORDER — MONTELUKAST SODIUM 10 MG PO TABS
10.0000 mg | ORAL_TABLET | Freq: Every evening | ORAL | 1 refills | Status: AC
  Filled 2024-10-04 – 2024-11-17 (×2): qty 90, 90d supply, fill #0

## 2024-10-04 MED ORDER — CETIRIZINE HCL 10 MG PO TABS
10.0000 mg | ORAL_TABLET | Freq: Every day | ORAL | 1 refills | Status: AC
  Filled 2024-10-04: qty 90, 90d supply, fill #0

## 2024-10-04 NOTE — Progress Notes (Signed)
 FOLLOW UP Date of Service/Encounter:  10/04/2024   Subjective:  Jane Wu (DOB: [birth year]) is a 53 y.o. female who [facility] for follow up for allergic rhinitis, recurrent sinus infection and, intermittent asthma   History obtained from: chart review and patient. Last visit was with me on 01/25/2204 and discussed due to uncontrolled rhinitis/cough, continue AIT and coming weekly for shots to build up to maintenance.  Also on Flonase , Azelastine , Singulair  and oral anti histamine.  Asthma controlled on PRN Xopenex  and Flovent  for flare ups.   Reports doing well until recently having some increased congestion, post nasal drainage and cough.  Stopped Singulair  recently due to lip quivering but also notes she was under a lot of stress at the time.  Using Flonase  and Xyzal  daily. Not sure Allegra  is helping.  Was doing well will allergy  shot buildup but has missed a few weeks due to increased drainage and job.  Was having trouble with local swelling with shots but doing better with Epiwashes.  Has seen PCP for drainage, congestion but has not been given prednisone /antibiotics.   Asthma has done fine.  Only flares up with changes in weather.  Rarely needs rescue inhaler and also has not needed Flovent . No ER/urgent care visits or oral prednisone  for flare ups.    Past Medical History: Past Medical History:  Diagnosis Date   Anemia, unspecified    Asthma    Obesity     Objective:  BP 118/76 (BP Location: Left Arm, Patient Position: Sitting, Cuff Size: Normal)   Pulse 88   Temp 98.3 F (36.8 C) (Temporal)   Wt 204 lb 1.6 oz (92.6 kg)   SpO2 98%   BMI 35.03 kg/m  Body mass index is 35.03 kg/m. Physical Exam: GEN: alert, well developed HEENT: clear conjunctiva, nose with mild inferior turbinate hypertrophy, pink nasal mucosa, + clear rhinorrhea, + cobblestoning HEART: regular rate and rhythm, no murmur LUNGS: clear to auscultation  bilaterally, no coughing, unlabored respiration SKIN: no rashes or lesions  Spirometry:  Tracings reviewed. Her effort: Good reproducible efforts. FVC: 2.44L, 83% predicted  FEV1: 2.15L, 91% predicted FEV1/FVC ratio: 88% Interpretation: Spirometry consistent with normal pattern.  Please see scanned spirometry results for details.  Assessment:   1. Seasonal and perennial allergic rhinitis   2. Recurrent sinus infections   3. Mild intermittent asthma without complication     Plan/Recommendations:  Allergic Rhinitis - Uncontrolled, discussed congestion/drainage are not always sinus infections and do not need antibiotics/oral prednisone .  Would recommend she try to stay on course with allergy  shots to help build up to maintenance (effective dose). Also discussed lip quivering is not a side effect of Singulair  and was likely related to her stress, recommend restarting.   - Positive skin test 08/2022: grasses, weeds, molds - Use nasal saline rinses before nose sprays such as with Neilmed Sinus Rinse over the counter bottle.  Use distilled water.   - Use Flonase  1-2 spray each nostril twice daily. Aim upward and outward. - Use Azelastine  1-2 sprays each nostril twice daily as needed for congestion, runny nose, drainage, sneezing.  Aim upward and outward. - Use Xyzal  5mg  daily in PM.  Use Zyrtec  10mg  in AM.   - Restart Singulair  10mg  daily.  Stop this if you have any behavioral or mood changes.   - Continue allergy  shots on schedule and Bring Epipen  with you at each shot visit. Initiated 01/2023.  Red vial reached 05/2024.  Okay to use Epiwashes.   Frequent Sinus Infections - Controlled  - Appropriate response to Pneumovax.  Ig were normal.   Mild Intermittent Asthma - Controlled with normal spirometry.  - With respiratory illness or flare ups, use Flovent  110mcg 2 puffs twice daily for 1-2 weeks.   - Rescue inhaler: Albuterol  2 puffs every 6 hours as needed for respiratory symptoms of  shortness of breath, or wheezing Asthma control goals:  Full participation in all desired activities (may need albuterol  before activity) Albuterol  use two times or less a week on average (not counting use with activity) Cough interfering with sleep two times or less a month Oral steroids no more than once a year No hospitalizations    Return in about 6 months (around 04/04/2025).  Arleta Blanch, MD [facility] 

## 2024-10-04 NOTE — Addendum Note (Signed)
 Addended by: AZALEA, Jaleeah Slight on: 10/04/2024 02:20 PM   Modules accepted: Orders

## 2024-10-04 NOTE — Patient Instructions (Addendum)
 Allergic Rhinitis - Positive skin test 08/2022: grasses, weeds, molds - Use nasal saline rinses before nose sprays such as with Neilmed Sinus Rinse over the counter bottle.  Use distilled water.   - Use Flonase  1-2 spray each nostril twice daily. Aim upward and outward. - Use Azelastine  1-2 sprays each nostril twice daily as needed for congestion, runny nose, drainage, sneezing.  Aim upward and outward. - Use Xyzal  5mg  daily in PM.  Use Zyrtec  10mg  in AM.   - Restart Singulair  10mg  daily.  Stop this if you have any behavioral or mood changes.   - Continue allergy  shots on schedule and Bring Epipen  with you at each shot visit. Initiated 01/2023.  Red vial reached 05/2024.  Okay to use Epiwashes.   Mild Intermittent Asthma - With respiratory illness or flare ups, use Flovent  110mcg 2 puffs twice daily for 1-2 weeks.   - Rescue inhaler: Albuterol  2 puffs every 6 hours as needed for respiratory symptoms of shortness of breath, or wheezing Asthma control goals:  Full participation in all desired activities (may need albuterol  before activity) Albuterol  use two times or less a week on average (not counting use with activity) Cough interfering with sleep two times or less a month Oral steroids no more than once a year No hospitalizations  Frequent Sinus Infections - Appropriate response to Pneumovax.  Ig were normal.

## 2024-10-05 ENCOUNTER — Encounter: Payer: Self-pay | Admitting: Allergy & Immunology

## 2024-10-05 ENCOUNTER — Ambulatory Visit (INDEPENDENT_AMBULATORY_CARE_PROVIDER_SITE_OTHER)

## 2024-10-05 DIAGNOSIS — J302 Other seasonal allergic rhinitis: Secondary | ICD-10-CM

## 2024-10-05 DIAGNOSIS — J3089 Other allergic rhinitis: Secondary | ICD-10-CM | POA: Diagnosis not present

## 2024-10-11 ENCOUNTER — Other Ambulatory Visit (HOSPITAL_BASED_OUTPATIENT_CLINIC_OR_DEPARTMENT_OTHER): Payer: Self-pay

## 2024-10-14 ENCOUNTER — Other Ambulatory Visit (HOSPITAL_BASED_OUTPATIENT_CLINIC_OR_DEPARTMENT_OTHER): Payer: Self-pay

## 2024-10-17 ENCOUNTER — Other Ambulatory Visit (HOSPITAL_BASED_OUTPATIENT_CLINIC_OR_DEPARTMENT_OTHER): Payer: Self-pay

## 2024-10-17 ENCOUNTER — Ambulatory Visit: Admitting: Internal Medicine

## 2024-10-19 ENCOUNTER — Ambulatory Visit (INDEPENDENT_AMBULATORY_CARE_PROVIDER_SITE_OTHER)

## 2024-10-19 ENCOUNTER — Other Ambulatory Visit (HOSPITAL_BASED_OUTPATIENT_CLINIC_OR_DEPARTMENT_OTHER): Payer: Self-pay

## 2024-10-19 DIAGNOSIS — J309 Allergic rhinitis, unspecified: Secondary | ICD-10-CM

## 2024-10-19 DIAGNOSIS — J3089 Other allergic rhinitis: Secondary | ICD-10-CM

## 2024-10-21 ENCOUNTER — Other Ambulatory Visit (HOSPITAL_BASED_OUTPATIENT_CLINIC_OR_DEPARTMENT_OTHER): Payer: Self-pay

## 2024-10-25 ENCOUNTER — Other Ambulatory Visit (HOSPITAL_BASED_OUTPATIENT_CLINIC_OR_DEPARTMENT_OTHER): Payer: Self-pay

## 2024-10-25 MED ORDER — AMOXICILLIN-POT CLAVULANATE 875-125 MG PO TABS
1.0000 | ORAL_TABLET | Freq: Two times a day (BID) | ORAL | 0 refills | Status: AC
  Filled 2024-10-25: qty 20, 10d supply, fill #0

## 2024-10-26 ENCOUNTER — Other Ambulatory Visit (HOSPITAL_BASED_OUTPATIENT_CLINIC_OR_DEPARTMENT_OTHER): Payer: Self-pay

## 2024-10-27 ENCOUNTER — Other Ambulatory Visit (HOSPITAL_BASED_OUTPATIENT_CLINIC_OR_DEPARTMENT_OTHER): Payer: Self-pay

## 2024-10-27 MED ORDER — BENZONATATE 100 MG PO CAPS
100.0000 mg | ORAL_CAPSULE | Freq: Three times a day (TID) | ORAL | 0 refills | Status: AC
  Filled 2024-10-27: qty 30, 10d supply, fill #0

## 2024-10-28 ENCOUNTER — Other Ambulatory Visit (HOSPITAL_BASED_OUTPATIENT_CLINIC_OR_DEPARTMENT_OTHER): Payer: Self-pay

## 2024-11-07 ENCOUNTER — Other Ambulatory Visit (HOSPITAL_BASED_OUTPATIENT_CLINIC_OR_DEPARTMENT_OTHER): Payer: Self-pay

## 2024-11-08 ENCOUNTER — Other Ambulatory Visit (HOSPITAL_BASED_OUTPATIENT_CLINIC_OR_DEPARTMENT_OTHER): Payer: Self-pay

## 2024-11-09 ENCOUNTER — Ambulatory Visit

## 2024-11-09 DIAGNOSIS — J302 Other seasonal allergic rhinitis: Secondary | ICD-10-CM

## 2024-11-17 ENCOUNTER — Other Ambulatory Visit (HOSPITAL_BASED_OUTPATIENT_CLINIC_OR_DEPARTMENT_OTHER): Payer: Self-pay

## 2024-11-18 ENCOUNTER — Ambulatory Visit

## 2024-11-18 DIAGNOSIS — J302 Other seasonal allergic rhinitis: Secondary | ICD-10-CM | POA: Diagnosis not present

## 2024-11-23 ENCOUNTER — Other Ambulatory Visit: Payer: Self-pay

## 2024-11-23 ENCOUNTER — Other Ambulatory Visit (HOSPITAL_COMMUNITY): Payer: Self-pay

## 2024-11-23 ENCOUNTER — Other Ambulatory Visit (HOSPITAL_BASED_OUTPATIENT_CLINIC_OR_DEPARTMENT_OTHER): Payer: Self-pay

## 2024-11-25 ENCOUNTER — Ambulatory Visit

## 2024-11-25 DIAGNOSIS — J302 Other seasonal allergic rhinitis: Secondary | ICD-10-CM

## 2025-04-04 ENCOUNTER — Ambulatory Visit: Admitting: Allergy & Immunology

## 2025-04-07 ENCOUNTER — Ambulatory Visit: Admitting: Allergy & Immunology
